# Patient Record
Sex: Female | Born: 1980
Health system: Southern US, Community
[De-identification: ages and names within clinical notes are randomized; demographics above are authoritative.]

## PROBLEM LIST (undated history)

## (undated) DIAGNOSIS — J45909 Unspecified asthma, uncomplicated: Secondary | ICD-10-CM

## (undated) DIAGNOSIS — J309 Allergic rhinitis, unspecified: Secondary | ICD-10-CM

## (undated) HISTORY — DX: Allergic rhinitis, unspecified: J30.9

## (undated) HISTORY — DX: Unspecified asthma, uncomplicated: J45.909

## (undated) HISTORY — PX: WISDOM TOOTH EXTRACTION: SHX21

---
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
## 2000-02-13 ENCOUNTER — Other Ambulatory Visit: Admission: RE | Admit: 2000-02-13 | Discharge: 2000-02-13 | Payer: Self-pay | Admitting: *Deleted

## 2001-02-11 ENCOUNTER — Other Ambulatory Visit: Admission: RE | Admit: 2001-02-11 | Discharge: 2001-02-11 | Payer: Self-pay | Admitting: Obstetrics and Gynecology

## 2003-02-16 ENCOUNTER — Other Ambulatory Visit: Admission: RE | Admit: 2003-02-16 | Discharge: 2003-02-16 | Payer: Self-pay | Admitting: Obstetrics and Gynecology

## 2005-03-12 ENCOUNTER — Encounter: Admission: RE | Admit: 2005-03-12 | Discharge: 2005-03-12 | Payer: Self-pay | Admitting: Sports Medicine

## 2006-11-23 ENCOUNTER — Inpatient Hospital Stay (HOSPITAL_COMMUNITY): Admission: AD | Admit: 2006-11-23 | Discharge: 2006-11-23 | Payer: Self-pay | Admitting: Gynecology

## 2006-11-26 ENCOUNTER — Inpatient Hospital Stay (HOSPITAL_COMMUNITY): Admission: RE | Admit: 2006-11-26 | Discharge: 2006-11-29 | Payer: Self-pay | Admitting: Obstetrics and Gynecology

## 2009-10-05 ENCOUNTER — Inpatient Hospital Stay (HOSPITAL_COMMUNITY)
Admission: RE | Admit: 2009-10-05 | Discharge: 2009-10-08 | Payer: Self-pay | Source: Home / Self Care | Admitting: Obstetrics and Gynecology

## 2010-04-06 LAB — CBC
HCT: 31.3 % — ABNORMAL LOW (ref 36.0–46.0)
HCT: 37.9 % (ref 36.0–46.0)
Hemoglobin: 10.9 g/dL — ABNORMAL LOW (ref 12.0–15.0)
Hemoglobin: 12.9 g/dL (ref 12.0–15.0)
MCH: 32.2 pg (ref 26.0–34.0)
MCH: 31.4 pg (ref 26.0–34.0)
MCHC: 35 g/dL (ref 30.0–36.0)
MCHC: 33.9 g/dL (ref 30.0–36.0)
MCV: 92.2 fL (ref 78.0–100.0)
MCV: 92.5 fL (ref 78.0–100.0)
Platelets: 163 10*3/uL (ref 150–400)
Platelets: 204 10*3/uL (ref 150–400)
RBC: 3.39 MIL/uL — ABNORMAL LOW (ref 3.87–5.11)
RBC: 4.1 MIL/uL (ref 3.87–5.11)
RDW: 13.8 % (ref 11.5–15.5)
RDW: 13.2 % (ref 11.5–15.5)
WBC: 12.3 10*3/uL — ABNORMAL HIGH (ref 4.0–10.5)
WBC: 12.6 10*3/uL — ABNORMAL HIGH (ref 4.0–10.5)

## 2010-04-06 LAB — SURGICAL PCR SCREEN
MRSA, PCR: NEGATIVE
Staphylococcus aureus: NEGATIVE

## 2010-04-06 LAB — RPR: RPR Ser Ql: NONREACTIVE

## 2010-10-31 LAB — DIFFERENTIAL
Basophils Absolute: 0
Lymphocytes Relative: 15
Monocytes Absolute: 0.9 — ABNORMAL HIGH
Monocytes Relative: 6
Neutro Abs: 12.5 — ABNORMAL HIGH

## 2010-10-31 LAB — CBC
HCT: 29.1 — ABNORMAL LOW
Hemoglobin: 13.5
MCHC: 33.3
MCHC: 34.9
MCV: 89.3
MCV: 92.1
MCV: 92.1
Platelets: 188
Platelets: 207
RBC: 4.39
RDW: 13.7
WBC: 13.7 — ABNORMAL HIGH

## 2010-10-31 LAB — COMPREHENSIVE METABOLIC PANEL
ALT: 28
AST: 26
Albumin: 1.9 — ABNORMAL LOW
Albumin: 2.4 — ABNORMAL LOW
BUN: 6
Calcium: 9.1
Chloride: 106
Creatinine, Ser: 0.69
Creatinine, Ser: 0.73
GFR calc Af Amer: >60
Sodium: 137
Total Bilirubin: 0.6
Total Bilirubin: 0.7
Total Protein: 4.6 — ABNORMAL LOW

## 2010-10-31 LAB — URINALYSIS, DIPSTICK ONLY
Bilirubin Urine: NEGATIVE
Glucose, UA: NEGATIVE
Ketones, ur: NEGATIVE
Leukocytes, UA: NEGATIVE
Nitrite: NEGATIVE
Protein, ur: NEGATIVE
Specific Gravity, Urine: 1.02
Urobilinogen, UA: 0.2
pH: 7

## 2013-03-26 ENCOUNTER — Ambulatory Visit (INDEPENDENT_AMBULATORY_CARE_PROVIDER_SITE_OTHER): Payer: PRIVATE HEALTH INSURANCE | Admitting: Physician Assistant

## 2013-03-26 ENCOUNTER — Encounter: Payer: Self-pay | Admitting: Physician Assistant

## 2013-03-26 VITALS — BP 136/72 | HR 86 | Temp 98.2°F | Resp 18 | Ht 66.0 in | Wt 183.0 lb

## 2013-03-26 DIAGNOSIS — A499 Bacterial infection, unspecified: Secondary | ICD-10-CM

## 2013-03-26 DIAGNOSIS — J988 Other specified respiratory disorders: Secondary | ICD-10-CM

## 2013-03-26 DIAGNOSIS — B9689 Other specified bacterial agents as the cause of diseases classified elsewhere: Principal | ICD-10-CM

## 2013-03-26 MED ORDER — AZITHROMYCIN 250 MG PO TABS: ORAL_TABLET | ORAL | Status: DC

## 2013-03-26 NOTE — Progress Notes (Signed)
    Patient ID: Tammy Dorsey MRN: [id number], DOB: 18-Aug-1980, 33 y.o. Date of Encounter: 03/26/2013, 3:46 PM    Chief Complaint:  Chief Complaint  Patient presents with  . Ilnness    cough, runny nose, sore throat, headache, head congestion     HPI: 33 y.o. year old white female is that she has been sick for 5 days and is only getting worse. Says it started with some sore throat and headache but since has developed into a lot of head and nasal congestion and sinus congestion. Now is also developing some chest congestion and cough. Has had no known fevers or chills.     Home Meds: See attached medication section for any medications that were entered at today's visit. The computer does not put those onto this list.The following list is a list of meds entered prior to today's visit.   No current outpatient prescriptions on file prior to visit.   No current facility-administered medications on file prior to visit.    Allergies: No Known Allergies    Review of Systems: See HPI for pertinent ROS. All other ROS negative.    Physical Exam: Blood pressure 136/72, pulse 86, temperature 98.2 F (36.8 C), resp. rate 18, height 5\' 6"  (1.676 m), weight 183 lb (83.008 kg), last menstrual period 03/06/2013., Body mass index is 29.55 kg/(m^2). General: WNWD WF.  Appears in no acute distress. HEENT: Normocephalic, atraumatic, eyes without discharge, sclera non-icteric, nares are without discharge. Bilateral auditory canals clear, TM's are without perforation, pearly grey and translucent with reflective cone of light bilaterally. Oral cavity moist, posterior pharynx without exudate, erythema, peritonsillar abscess. No tenderness with percussion of frontal and maxillary sinuses.  Neck: Supple. No thyromegaly. No lymphadenopathy. Lungs: Clear bilaterally to auscultation without wheezes, rales, or rhonchi. Breathing is unlabored. Heart: Regular rhythm. No murmurs, rubs, or gallops. Msk:  Strength  and tone normal for age. Extremities/Skin: Warm and dry. Neuro: Alert and oriented X 3. Moves all extremities spontaneously. Gait is normal. CNII-XII grossly in tact. Psych:  Responds to questions appropriately with a normal affect.     ASSESSMENT AND PLAN:  33 y.o. year old female with  1. Bacterial respiratory infection - azithromycin (ZITHROMAX) 250 MG tablet; Day 1: Take 2 daily. Days 2-5: Take 1 daily.  Dispense: 6 tablet; Refill: 0 Can continue over-the-counter decongestants and cough medications as needed for symptom relief. Followup if symptoms do not resolve within one week after completion of antibiotics.  [address] [place], [place], [facility] 03/26/2013 3:46 PM

## 2014-07-06 ENCOUNTER — Ambulatory Visit (INDEPENDENT_AMBULATORY_CARE_PROVIDER_SITE_OTHER): Payer: 59 | Admitting: Family Medicine

## 2014-07-06 ENCOUNTER — Encounter: Payer: Self-pay | Admitting: Family Medicine

## 2014-07-06 VITALS — BP 112/70 | HR 62 | Temp 98.0°F | Resp 14 | Ht 66.0 in | Wt 185.0 lb

## 2014-07-06 DIAGNOSIS — R55 Syncope and collapse: Secondary | ICD-10-CM

## 2014-07-06 DIAGNOSIS — Z0000 Encounter for general adult medical examination without abnormal findings: Secondary | ICD-10-CM | POA: Diagnosis not present

## 2014-07-06 LAB — CBC WITH DIFFERENTIAL/PLATELET
BASOS ABS: 0 10*3/uL (ref 0.0–0.1)
Basophils Relative: 0 % (ref 0–1)
EOS PCT: 2 % (ref 0–5)
Eosinophils Absolute: 0.1 10*3/uL (ref 0.0–0.7)
HCT: 40.3 % (ref 36.0–46.0)
Hemoglobin: 13.4 g/dL (ref 12.0–15.0)
LYMPHS ABS: 2.1 10*3/uL (ref 0.7–4.0)
LYMPHS PCT: 32 % (ref 12–46)
MCH: 29.1 pg (ref 26.0–34.0)
MCHC: 33.3 g/dL (ref 30.0–36.0)
MCV: 87.6 fL (ref 78.0–100.0)
MPV: 9.8 fL (ref 8.6–12.4)
Monocytes Absolute: 0.5 10*3/uL (ref 0.1–1.0)
Monocytes Relative: 7 % (ref 3–12)
NEUTROS ABS: 4 10*3/uL (ref 1.7–7.7)
NEUTROS PCT: 59 % (ref 43–77)
Platelets: 267 10*3/uL (ref 150–400)
RBC: 4.6 MIL/uL (ref 3.87–5.11)
RDW: 13 % (ref 11.5–15.5)
WBC: 6.7 10*3/uL (ref 4.0–10.5)

## 2014-07-06 LAB — LIPID PANEL
CHOL/HDL RATIO: 2.6 ratio
Cholesterol: 139 mg/dL (ref 0–200)
HDL: 54 mg/dL (ref >=46)
LDL Cholesterol: 74 mg/dL (ref 0–99)
TRIGLYCERIDES: 53 mg/dL (ref <150)
VLDL: 11 mg/dL (ref 0–40)

## 2014-07-06 LAB — TSH: TSH: 1.084 u[IU]/mL (ref 0.350–4.500)

## 2014-07-06 LAB — COMPLETE METABOLIC PANEL WITH GFR
ALT: 23 U/L (ref 0–35)
AST: 19 U/L (ref 0–37)
Albumin: 4.1 g/dL (ref 3.5–5.2)
Alkaline Phosphatase: 42 U/L (ref 39–117)
BILIRUBIN TOTAL: 0.5 mg/dL (ref 0.2–1.2)
BUN: 14 mg/dL (ref 6–23)
CALCIUM: 9.2 mg/dL (ref 8.4–10.5)
CHLORIDE: 103 meq/L (ref 96–112)
CO2: 26 mEq/L (ref 19–32)
CREATININE: 0.93 mg/dL (ref 0.50–1.10)
GFR, Est African American: >89 mL/min
GFR, Est Non African American: 81 mL/min
Glucose, Bld: 88 mg/dL (ref 70–99)
Potassium: 3.9 mEq/L (ref 3.5–5.3)
SODIUM: 138 meq/L (ref 135–145)
Total Protein: 6.7 g/dL (ref 6.0–8.3)

## 2014-07-06 NOTE — Progress Notes (Signed)
Subjective:    Patient ID: Tammy Dorsey, female    DOB: 1980-11-10, 34 y.o.   MRN: [id number]  HPI Patient is a very pleasant 34 year old white female who presents today for complete physical exam. Recently she has been exercising more performing yoga. She has developed exercise induced near syncope almost on a daily basis. It is associated with dizziness and some vertigo. She denies palpitations. She denies shortness of breath. She denies angina. Symptoms improve with rest and are worse with activity. She denies any BPPV symptoms. Her Dix-Hallpike maneuver is negative today. She denies any fevers or chills. She denies any melanoma or hematochezia. She denies any easy bruising or heavy bleeding. She denies any nausea or vomiting. She denies any dehydration. She drinks plenty of fluids. Today her blood pressure is asymmetric in her arms 112/70 left, 92/70 right. No past medical history on file. Patient has 2 children. Uncomplicated childbirth No past surgical history on file. No current outpatient prescriptions on file prior to visit.   No current facility-administered medications on file prior to visit.   No Known Allergies History   Social History  . Marital Status: Married    Spouse Name: N/A  . Number of Children: N/A  . Years of Education: N/A   Occupational History  . Not on file.   Social History Main Topics  . Smoking status: Never Smoker   . Smokeless tobacco: Never Used  . Alcohol Use: 1.2 oz/week    2 Glasses of wine per week  . Drug Use: No  . Sexual Activity: Yes     Comment: Married, 2 kids   Other Topics Concern  . Not on file   Social History Narrative   Family History  Problem Relation Age of Onset  . Heart disease Maternal Uncle   . Early death Neg Hx       Review of Systems  All other systems reviewed and are negative.      Objective:   Physical Exam  Constitutional: She is oriented to person, place, and time. She appears well-developed  and well-nourished. No distress.  HENT:  Head: Normocephalic and atraumatic.  Right Ear: External ear normal.  Left Ear: External ear normal.  Nose: Nose normal.  Mouth/Throat: Oropharynx is clear and moist. No oropharyngeal exudate.  Eyes: Conjunctivae and EOM are normal. Pupils are equal, round, and reactive to light. Right eye exhibits no discharge. Left eye exhibits no discharge. No scleral icterus.  Neck: Normal range of motion. Neck supple. No JVD present. No tracheal deviation present. No thyromegaly present.  Cardiovascular: Normal rate, regular rhythm, normal heart sounds and intact distal pulses.  Exam reveals no gallop and no friction rub.   No murmur heard. Pulmonary/Chest: Effort normal and breath sounds normal. No stridor. No respiratory distress. She has no wheezes. She has no rales. She exhibits no tenderness.  Abdominal: Soft. Bowel sounds are normal. She exhibits no distension and no mass. There is no tenderness. There is no rebound and no guarding.  Musculoskeletal: Normal range of motion. She exhibits no edema or tenderness.  Lymphadenopathy:    She has no cervical adenopathy.  Neurological: She is alert and oriented to person, place, and time. She has normal reflexes. She displays normal reflexes. No cranial nerve deficit. She exhibits normal muscle tone. Coordination normal.  Skin: Skin is warm. No rash noted. She is not diaphoretic. No erythema. No pallor.  Psychiatric: She has a normal mood and affect. Her behavior is normal. Judgment and  thought content normal.  Vitals reviewed.         Assessment & Plan:  Routine general medical examination at a health care facility - Plan: CBC with Differential/Platelet, COMPLETE METABOLIC PANEL WITH GFR, Lipid panel, TSH  Near syncope - Plan: EKG 12-Lead  I will begin with basic lab work including a CBC, CMP, fasting lipid panel, and TSH. I will also check an EKG. I believe we first need to rule out the patient for anemia  given her age and her normal menstrual cycles. If there is no evidence of anemia or other laboratory abnormalities, I would next pursue the asymmetric blood pressures that she has. Although unlikely, I need to rule out subclavian steal as a possible cause of her near syncope. I will also evaluate her heart with an echocardiogram although I can appreciate no murmurs and I believe the likelihood of cardiomyopathy is very low. I would also consider a 24-hour Holter monitor to rule out cardiac arrhythmias. EKG performed today in office shows normal sinus rhythm with normal intervals and a normal axis and no evidence of ischemia or infarction.

## 2014-07-08 ENCOUNTER — Other Ambulatory Visit: Payer: Self-pay | Admitting: Family Medicine

## 2014-07-08 DIAGNOSIS — R0989 Other specified symptoms and signs involving the circulatory and respiratory systems: Secondary | ICD-10-CM

## 2014-07-08 DIAGNOSIS — R55 Syncope and collapse: Secondary | ICD-10-CM

## 2014-07-09 ENCOUNTER — Telehealth: Payer: Self-pay | Admitting: Family Medicine

## 2014-07-09 ENCOUNTER — Other Ambulatory Visit: Payer: Self-pay

## 2014-07-09 NOTE — Telephone Encounter (Signed)
Pt aware.

## 2014-07-09 NOTE — Telephone Encounter (Signed)
Let's do 72 hour.

## 2014-07-09 NOTE — Telephone Encounter (Signed)
Patient states that when the cardiologist called her yesterday to set up the monitor she said that they told her it was for 72 hours.  Patient states when she spoke with Dr. Dennard Schaumann and Lovey Newcomer they both told her it was going to be a 24 hour monitor. Please call patient back and confirm which one she is supposed to do.

## 2014-07-12 ENCOUNTER — Ambulatory Visit
Admission: RE | Admit: 2014-07-12 | Discharge: 2014-07-12 | Disposition: A | Discharge status: Home / Self Care | Payer: 59 | Source: Ambulatory Visit | Attending: Family Medicine | Admitting: Family Medicine

## 2014-07-12 DIAGNOSIS — R55 Syncope and collapse: Secondary | ICD-10-CM

## 2014-07-12 DIAGNOSIS — R0989 Other specified symptoms and signs involving the circulatory and respiratory systems: Secondary | ICD-10-CM

## 2014-07-12 MED ORDER — IOPAMIDOL (ISOVUE-370) INJECTION 76%
75.0000 mL | Freq: Once | INTRAVENOUS | Status: AC | PRN
  Administered 2014-07-12: 75 mL via INTRAVENOUS

## 2014-07-14 ENCOUNTER — Encounter (INDEPENDENT_AMBULATORY_CARE_PROVIDER_SITE_OTHER): Payer: 59

## 2014-07-14 ENCOUNTER — Ambulatory Visit (HOSPITAL_COMMUNITY): Payer: 59 | Attending: Cardiology

## 2014-07-14 ENCOUNTER — Other Ambulatory Visit: Payer: Self-pay

## 2014-07-14 DIAGNOSIS — R55 Syncope and collapse: Secondary | ICD-10-CM | POA: Insufficient documentation

## 2014-07-14 HISTORY — DX: Syncope and collapse: R55

## 2015-06-09 ENCOUNTER — Encounter: Payer: Self-pay | Admitting: Family Medicine

## 2015-06-09 ENCOUNTER — Ambulatory Visit (INDEPENDENT_AMBULATORY_CARE_PROVIDER_SITE_OTHER): Payer: Commercial Managed Care - PPO | Admitting: Family Medicine

## 2015-06-09 VITALS — BP 122/62 | HR 80 | Temp 98.9°F | Resp 14 | Ht 66.0 in | Wt 188.0 lb

## 2015-06-09 DIAGNOSIS — R5382 Chronic fatigue, unspecified: Secondary | ICD-10-CM | POA: Diagnosis not present

## 2015-06-09 DIAGNOSIS — R1012 Left upper quadrant pain: Secondary | ICD-10-CM | POA: Diagnosis not present

## 2015-06-09 LAB — COMPLETE METABOLIC PANEL WITH GFR
ALBUMIN: 4.1 g/dL (ref 3.6–5.1)
ALT: 19 U/L (ref 6–29)
AST: 17 U/L (ref 10–30)
Alkaline Phosphatase: 40 U/L (ref 33–115)
BUN: 15 mg/dL (ref 7–25)
CHLORIDE: 101 mmol/L (ref 98–110)
CO2: 27 mmol/L (ref 20–31)
Calcium: 9.1 mg/dL (ref 8.6–10.2)
Creat: 0.86 mg/dL (ref 0.50–1.10)
GFR, EST NON AFRICAN AMERICAN: 88 mL/min (ref >=60)
GFR, Est African American: >89 mL/min (ref >=60)
GLUCOSE: 83 mg/dL (ref 70–99)
POTASSIUM: 4.2 mmol/L (ref 3.5–5.3)
Sodium: 137 mmol/L (ref 135–146)
TOTAL PROTEIN: 6.4 g/dL (ref 6.1–8.1)
Total Bilirubin: 0.4 mg/dL (ref 0.2–1.2)

## 2015-06-09 LAB — CBC WITH DIFFERENTIAL/PLATELET
BASOS PCT: 0 %
Basophils Absolute: 0 cells/uL (ref 0–200)
EOS ABS: 91 {cells}/uL (ref 15–500)
Eosinophils Relative: 1 %
HEMATOCRIT: 41.2 % (ref 35.0–45.0)
HEMOGLOBIN: 13.8 g/dL (ref 12.0–15.0)
LYMPHS ABS: 2821 {cells}/uL (ref 850–3900)
LYMPHS PCT: 31 %
MCH: 29.7 pg (ref 27.0–33.0)
MCHC: 33.5 g/dL (ref 32.0–36.0)
MCV: 88.6 fL (ref 80.0–100.0)
MONO ABS: 637 {cells}/uL (ref 200–950)
MPV: 9.8 fL (ref 7.5–12.5)
Monocytes Relative: 7 %
Neutro Abs: 5551 cells/uL (ref 1500–7800)
Neutrophils Relative %: 61 %
Platelets: 240 10*3/uL (ref 140–400)
RBC: 4.65 MIL/uL (ref 3.80–5.10)
RDW: 12.7 % (ref 11.0–15.0)
WBC: 9.1 10*3/uL (ref 3.8–10.8)

## 2015-06-09 LAB — TSH: TSH: 1.21 m[IU]/L

## 2015-06-09 LAB — VITAMIN B12: Vitamin B-12: 518 pg/mL (ref 200–1100)

## 2015-06-09 NOTE — Progress Notes (Signed)
Subjective:    Patient ID: Tammy Dorsey, female    DOB: Apr 21, 1980, 35 y.o.   MRN: [id number]  HPI Patient is a very pleasant 35 year old white female who presents today with 10-14 days of fatigue. Around that same time she developed an evanescent rash on her chest and on her arms. She went to an urgent care and was given a steroid shot and the rash resolved. They're treating her for some type of allergic reaction. She states that the rash was not raised. She describes a rash more as a blotchy erythema on her chest and arms. She is not sure what she was having allergic reaction to. [place] but has not witnessed any tick bites. Around the same time she is also developed a dull to sharp pain in her left posterior neck. She also has an occasional dull headache which is nonspecific and nonpulsatile with no photophobia. She is also developed a persistent mild left upper quadrant abdominal pain. She denies any fevers or chills. She denies any otalgia or sore throat. Her child has been sick recently with a viral illness. She denies any chest pain shortness of breath or dyspnea on exertion. She denies any nausea vomiting or diarrhea. She denies any black tarry stools or blood in her stool. She denies any hematuria or dysuria. She does have some mild joint pains. She is checked urine pregnancy test at home and it was negative. She also has a Mirena she denies any recent travel. No past medical history on file. No past surgical history on file. No current outpatient prescriptions on file prior to visit.   No current facility-administered medications on file prior to visit.   No Known Allergies Social History   Social History  . Marital Status: Married    Spouse Name: N/A  . Number of Children: N/A  . Years of Education: N/A   Occupational History  . Not on file.   Social History Main Topics  . Smoking status: Never Smoker   . Smokeless tobacco: Never Used  . Alcohol  Use: 1.2 oz/week    2 Glasses of wine per week  . Drug Use: No  . Sexual Activity: Yes     Comment: Married, 2 kids   Other Topics Concern  . Not on file   Social History Narrative      Review of Systems  All other systems reviewed and are negative.      Objective:   Physical Exam  Constitutional: She appears well-developed and well-nourished. No distress.  HENT:  Mouth/Throat: Oropharynx is clear and moist. No oropharyngeal exudate.  Eyes: Conjunctivae are normal. No scleral icterus.  Neck: Neck supple. No JVD present. No thyromegaly present.  Cardiovascular: Normal rate, regular rhythm and normal heart sounds.   Pulmonary/Chest: Effort normal and breath sounds normal. No respiratory distress.  Abdominal: Soft. Bowel sounds are normal. She exhibits no distension. There is no tenderness. There is no rebound and no guarding.  Musculoskeletal: She exhibits no edema.  Lymphadenopathy:    She has no cervical adenopathy.  Skin: No rash noted. She is not diaphoretic. No erythema.  Vitals reviewed.         Assessment & Plan:  Chronic fatigue - Plan: CBC with Differential/Platelet, COMPLETE METABOLIC PANEL WITH GFR, B. burgdorfi antibodies by WB, [place] m[place] spotted fvr abs pnl(IgG+IgM), Sedimentation rate, TSH, Vitamin B12, EBV ab to viral capsid ag pnl, IgG+IgM, CMV IgM, hCG, serum, qualitative  LUQ abdominal pain -  Plan: CBC with Differential/Platelet, COMPLETE METABOLIC PANEL WITH GFR, EBV ab to viral capsid ag pnl, IgG+IgM, CMV IgM, hCG, serum, qualitative  Difficult history with massive differential diagnosis. More than likely, the patient has a viral syndrome. Given her child's recent illness and her profound fatigue, I will check her for Epstein-Barr virus and cytomegalovirus. Given the time of year, I will also check for Lyme disease. I will check a CBC, CMP. I will check a sedimentation rate a TSH and a vitamin B12. I will also check a serum pregnancy test. Further workup  will be determined by the results of lab studies.

## 2015-06-10 LAB — SEDIMENTATION RATE: SED RATE: 1 mm/h (ref 0–20)

## 2015-06-10 LAB — EBV AB TO VIRAL CAPSID AG PNL, IGG+IGM: EBV VCA IGG: 177 U/mL — AB (ref <18.0)

## 2015-06-10 LAB — HCG, SERUM, QUALITATIVE: PREG SERUM: NEGATIVE

## 2015-06-10 LAB — CMV IGM: CMV IgM: <30 AU/mL (ref <30.00)

## 2015-06-13 ENCOUNTER — Telehealth: Payer: Self-pay | Admitting: Family Medicine

## 2015-06-13 LAB — ROCKY MTN SPOTTED FVR ABS PNL(IGG+IGM)
RMSF IgG: NOT DETECTED
RMSF IgM: NOT DETECTED

## 2015-06-13 LAB — LYME ABY, WSTRN BLT IGG & IGM W/BANDS
B BURGDORFERI IGM ABS (IB): NEGATIVE
B burgdorferi IgG Abs (IB): NEGATIVE
LYME DISEASE 18 KD IGG: NONREACTIVE
LYME DISEASE 28 KD IGG: NONREACTIVE
LYME DISEASE 30 KD IGG: NONREACTIVE
LYME DISEASE 41 KD IGM: NONREACTIVE
LYME DISEASE 58 KD IGG: NONREACTIVE
Lyme Disease 23 kD IgG: NONREACTIVE
Lyme Disease 23 kD IgM: NONREACTIVE
Lyme Disease 39 kD IgG: REACTIVE — AB
Lyme Disease 39 kD IgM: NONREACTIVE
Lyme Disease 41 kD IgG: NONREACTIVE
Lyme Disease 45 kD IgG: NONREACTIVE
Lyme Disease 66 kD IgG: NONREACTIVE
Lyme Disease 93 kD IgG: NONREACTIVE

## 2015-06-13 NOTE — Telephone Encounter (Signed)
Pt is calling for her lab results.  Please call [phone]

## 2015-06-15 NOTE — Telephone Encounter (Signed)
Pt aware - see lab note

## 2016-03-23 DIAGNOSIS — R1031 Right lower quadrant pain: Secondary | ICD-10-CM | POA: Diagnosis not present

## 2016-05-07 DIAGNOSIS — Z01419 Encounter for gynecological examination (general) (routine) without abnormal findings: Secondary | ICD-10-CM | POA: Diagnosis not present

## 2016-08-07 DIAGNOSIS — R6889 Other general symptoms and signs: Secondary | ICD-10-CM | POA: Diagnosis not present

## 2016-08-07 DIAGNOSIS — B349 Viral infection, unspecified: Secondary | ICD-10-CM | POA: Diagnosis not present

## 2016-10-02 ENCOUNTER — Ambulatory Visit (INDEPENDENT_AMBULATORY_CARE_PROVIDER_SITE_OTHER): Payer: 59 | Admitting: Family Medicine

## 2016-10-02 ENCOUNTER — Encounter: Payer: Self-pay | Admitting: Family Medicine

## 2016-10-02 VITALS — BP 118/64 | HR 82 | Temp 97.9°F | Resp 14 | Ht 66.0 in | Wt 194.0 lb

## 2016-10-02 DIAGNOSIS — B9689 Other specified bacterial agents as the cause of diseases classified elsewhere: Secondary | ICD-10-CM | POA: Diagnosis not present

## 2016-10-02 DIAGNOSIS — J019 Acute sinusitis, unspecified: Secondary | ICD-10-CM

## 2016-10-02 MED ORDER — FLUCONAZOLE 150 MG PO TABS: 150.0000 mg | ORAL_TABLET | Freq: Once | ORAL | 0 refills | Status: AC

## 2016-10-02 MED ORDER — AMOXICILLIN 875 MG PO TABS: 875.0000 mg | ORAL_TABLET | Freq: Two times a day (BID) | ORAL | 0 refills | Status: DC

## 2016-10-02 NOTE — Progress Notes (Signed)
   Subjective:    Patient ID: Tammy Dorsey, female    DOB: April 27, 1980, 36 y.o.   MRN: [id number]  HPI The patient's symptoms started with a cold more than a week ago. Similar symptoms were shared by other members of her family. They have all improved. She was initially improving but over the last 24 hours things have noticeably worsened. She is now running a low-grade fever of 99-100. She reports increasing postnasal drip, increasing sinus pressure, increasing ear pain, and headache. She reports fatigue. She also reports a scratchy throat due to drainage. History reviewed. No pertinent past medical history. History reviewed. No pertinent surgical history. No current outpatient prescriptions on file prior to visit.   No current facility-administered medications on file prior to visit.    No Known Allergies Social History   Social History  . Marital status: Married    Spouse name: N/A  . Number of children: N/A  . Years of education: N/A   Occupational History  . Not on file.   Social History Main Topics  . Smoking status: Never Smoker  . Smokeless tobacco: Never Used  . Alcohol use 1.2 oz/week    2 Glasses of wine per week  . Drug use: No  . Sexual activity: Yes     Comment: Married, 2 kids   Other Topics Concern  . Not on file   Social History Narrative  . No narrative on file      Review of Systems  All other systems reviewed and are negative.      Objective:   Physical Exam  Constitutional: She appears well-developed and well-nourished.  HENT:  Right Ear: External ear normal.  Left Ear: External ear normal.  Nose: Mucosal edema and rhinorrhea present. Right sinus exhibits frontal sinus tenderness. Left sinus exhibits frontal sinus tenderness.  Mouth/Throat: Oropharynx is clear and moist. No oropharyngeal exudate, posterior oropharyngeal edema or posterior oropharyngeal erythema.  Eyes: Conjunctivae are normal.  Neck: Neck supple.  Cardiovascular: Normal  rate, regular rhythm and normal heart sounds.   Pulmonary/Chest: Effort normal and breath sounds normal. No respiratory distress. She has no wheezes. She has no rales.  Lymphadenopathy:    She has no cervical adenopathy.  Vitals reviewed.         Assessment & Plan:  Acute bacterial rhinosinusitis - Plan: amoxicillin (AMOXIL) 875 MG tablet, fluconazole (DIFLUCAN) 150 MG tablet  I believe the patient had a viral upper respiratory infection that is subsequently developed a secondary bacterial sinus infection. This would explain the improvement followed by sudden worsening over the passage of time. Begin amoxicillin 875 mg by mouth twice a day for 10 days. I gave the patient a prescription for Diflucan in case she develops a yeast infection. Use Sudafed and Flonase for head congestion.

## 2017-02-10 DIAGNOSIS — J018 Other acute sinusitis: Secondary | ICD-10-CM | POA: Diagnosis not present

## 2017-02-28 ENCOUNTER — Encounter: Payer: Self-pay | Admitting: Family Medicine

## 2017-02-28 ENCOUNTER — Ambulatory Visit
Admission: RE | Admit: 2017-02-28 | Discharge: 2017-02-28 | Disposition: A | Discharge status: Home / Self Care | Payer: 59 | Source: Ambulatory Visit | Attending: Family Medicine | Admitting: Family Medicine

## 2017-02-28 ENCOUNTER — Ambulatory Visit (INDEPENDENT_AMBULATORY_CARE_PROVIDER_SITE_OTHER): Payer: Self-pay | Admitting: Family Medicine

## 2017-02-28 VITALS — BP 118/80 | HR 76 | Temp 98.0°F | Resp 16 | Ht 66.0 in | Wt 182.0 lb

## 2017-02-28 DIAGNOSIS — M545 Low back pain: Secondary | ICD-10-CM | POA: Diagnosis not present

## 2017-02-28 DIAGNOSIS — S134XXA Sprain of ligaments of cervical spine, initial encounter: Secondary | ICD-10-CM

## 2017-02-28 DIAGNOSIS — G5603 Carpal tunnel syndrome, bilateral upper limbs: Secondary | ICD-10-CM

## 2017-02-28 DIAGNOSIS — S3992XA Unspecified injury of lower back, initial encounter: Secondary | ICD-10-CM | POA: Diagnosis not present
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
## 2017-03-01 ENCOUNTER — Telehealth: Payer: Self-pay | Admitting: Family Medicine

## 2017-03-01 MED ORDER — CYCLOBENZAPRINE HCL 10 MG PO TABS: 10.0000 mg | ORAL_TABLET | Freq: Three times a day (TID) | ORAL | 0 refills | Status: DC | PRN

## 2017-03-01 NOTE — Telephone Encounter (Signed)
Pt called and is aware of xray results but wanted to know what she should do now in the meantime.   Per Dr. Dennard Schaumann needs to give 2-3 weeks and can use Flexeril in the meantime.  Med sent to pharm and pt aware.

## 2017-03-12 DIAGNOSIS — G5603 Carpal tunnel syndrome, bilateral upper limbs: Secondary | ICD-10-CM | POA: Diagnosis not present

## 2017-03-19 DIAGNOSIS — M79641 Pain in right hand: Secondary | ICD-10-CM | POA: Diagnosis not present

## 2017-03-19 DIAGNOSIS — R202 Paresthesia of skin: Secondary | ICD-10-CM | POA: Diagnosis not present

## 2017-03-19 DIAGNOSIS — M79642 Pain in left hand: Secondary | ICD-10-CM | POA: Diagnosis not present

## 2017-03-19 DIAGNOSIS — G5603 Carpal tunnel syndrome, bilateral upper limbs: Secondary | ICD-10-CM | POA: Diagnosis not present

## 2017-03-20 ENCOUNTER — Telehealth: Payer: Self-pay | Admitting: Family Medicine

## 2017-03-20 DIAGNOSIS — M545 Low back pain, unspecified: Secondary | ICD-10-CM

## 2017-03-20 NOTE — Telephone Encounter (Signed)
Pt called [facility] stating that her back is no better at all and is wondering what else she can do for the pain?

## 2017-03-21 MED ORDER — DICLOFENAC SODIUM 75 MG PO TBEC: 75.0000 mg | DELAYED_RELEASE_TABLET | Freq: Two times a day (BID) | ORAL | 1 refills | Status: DC

## 2017-03-21 NOTE — Telephone Encounter (Signed)
Patient aware of providers recommendations and would like to do PT, she does not need any more Flexeril and would like to try the Diclofenac. Med sent to pharm and referral placed.

## 2017-03-21 NOTE — Telephone Encounter (Signed)
I would recommend PT.  Meanwhile use diclofenac 75 mg pobid and flexeril 10 mg q 8 hrs muscle spasms (can make her sleepy).

## 2017-04-01 ENCOUNTER — Telehealth (HOSPITAL_COMMUNITY): Payer: Self-pay | Admitting: Family Medicine

## 2017-04-01 NOTE — Telephone Encounter (Signed)
04/01/17  Pt called and cx appt here and asked that we forward the referral to [facility].  She said it would be eaiser for her to go there because of work and having to pick up her kids.

## 2017-04-04 ENCOUNTER — Ambulatory Visit (HOSPITAL_COMMUNITY): Payer: 59

## 2017-04-23 ENCOUNTER — Ambulatory Visit: Payer: 59 | Attending: Family Medicine | Admitting: Physical Therapy

## 2017-04-23 ENCOUNTER — Encounter: Payer: Self-pay | Admitting: Physical Therapy

## 2017-04-23 DIAGNOSIS — R252 Cramp and spasm: Secondary | ICD-10-CM | POA: Insufficient documentation

## 2017-04-23 DIAGNOSIS — M6281 Muscle weakness (generalized): Secondary | ICD-10-CM | POA: Insufficient documentation

## 2017-04-23 DIAGNOSIS — M545 Low back pain, unspecified: Secondary | ICD-10-CM
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
## 2017-04-30 ENCOUNTER — Encounter: Payer: Self-pay | Admitting: Physical Therapy

## 2017-04-30 ENCOUNTER — Ambulatory Visit: Payer: 59 | Admitting: Physical Therapy

## 2017-04-30 DIAGNOSIS — M6281 Muscle weakness (generalized): Secondary | ICD-10-CM

## 2017-04-30 DIAGNOSIS — M545 Low back pain, unspecified: Secondary | ICD-10-CM

## 2017-04-30 DIAGNOSIS — R252 Cramp and spasm: Secondary | ICD-10-CM

## 2017-04-30 NOTE — Therapy (Signed)
[place], [place], [address] Phone: [phone]   Fax:  [phone]  Physical Therapy Treatment  Patient Details  Name: Tammy Dorsey MRN: [id number] Date of Birth: [birth year] Referring Provider: Dr. Jenna Luo    Encounter Date: 04/30/2017  PT End of Session - 04/30/17 0813    Visit Number  2    Number of Visits  12    Date for PT Re-Evaluation  06/04/17    PT Start Time  0803    PT Stop Time  0850    PT Time Calculation (min)  47 min    Activity Tolerance  Patient tolerated treatment well    Behavior During Therapy  [facility] for tasks assessed/performed       History reviewed. No pertinent past medical history.  History reviewed. No pertinent surgical history.  There were no vitals filed for this visit.  Subjective Assessment - 04/30/17 0803    Subjective  No new complaints     Currently in Pain?  Yes    Pain Score  5     Pain Orientation  Lower;Left    Pain Type  Acute pain    Pain Onset  More than a month ago              [facility] Adult PT Treatment/Exercise - 04/30/17 0001      Lumbar Exercises: Stretches   Single Knee to Chest Stretch  2 reps;30 seconds      Lumbar Exercises: Supine   Clam  20 reps    Bent Knee Raise  10 reps    Bridge  --    Bridge with [employer]  10 reps    Other Supine Lumbar Exercises  used ball under sacrum for stabilization challenge       Lumbar Exercises: Prone   Other Prone Lumbar Exercises  prone pelvic press series x 10       Modalities   Modalities  Cryotherapy      Cryotherapy   Number Minutes Cryotherapy  10 Minutes    Cryotherapy Location  Lumbar Spine    Type of Cryotherapy  Ice pack      Manual Therapy   Manual Therapy  Joint mobilization;Soft tissue mobilization    Joint Mobilization  SIJ bilaterally, rotational mobs, hips bilaterall ER and IR with compression to piriformis     Soft tissue mobilization  piriformis               PT Education - 04/30/17 0814    Education provided  Yes    Education Details  stabilization, ice for more acute pain     Person(s) Educated  Patient    Methods  Explanation    Comprehension  Verbalized understanding          PT Long Term Goals - 04/30/17 0814      PT LONG TERM GOAL #1   Title  Pt will be I with HEP for core stabilty, hips and trunk flexibility     Status  On-going      PT LONG TERM GOAL #2   Title  Pt will be able to demo safe lifting technique for 20-30 lbs from the floor without exacerbating back pain     Status  On-going      PT LONG TERM GOAL #3   Title  Pt will sit as long as she needs to comfortably for meals, recreation, work.     Status  On-going  PT LONG TERM GOAL #4   Title  Pt will improve FOTO to less than 28% to demo improved functional mobility.     Status  On-going      PT LONG TERM GOAL #5   Title  Pt will return to light running without pain increase for up to mile.     Status  On-going            Plan - 04/30/17 0841    Clinical Impression Statement  Pt able to perform stability exercises in all positions.  Did have some reproduction of symptoms with manual pressure to lateral SIJ, sharp in nature.  Offered ice to calm sx.      PT Treatment/Interventions  ADLs/Self Care Home Management;Electrical Stimulation;Functional mobility training;Neuromuscular re-education;Taping;Therapeutic activities;Iontophoresis 4mg /ml Dexamethasone;Moist Heat;Traction;Cryotherapy;Ultrasound;Therapeutic exercise;Patient/family education;Dry needling;Manual techniques    PT Next Visit Plan  check HEP, begin stabilization (prone progression ), MHP, posture, lifting as able.     PT Home Exercise Plan  knee to chest, bent knee level 1 , Tr A ab progression (prone)     Consulted and Agree with Plan of Care  Patient       Patient will benefit from skilled therapeutic intervention in order to improve the following deficits and impairments:   Decreased mobility, Increased muscle spasms, Hypermobility, Decreased strength, Increased fascial restricitons, Pain  Visit Diagnosis: Acute midline low back pain without sciatica  Cramp and spasm  Muscle weakness (generalized)     Problem List Patient Active Problem List   Diagnosis Date Noted  . Near syncope 07/14/2014    PAA,JENNIFER 04/30/2017, 11:52 AM  [facility] [address] [place], [place], [address] Phone: [phone]   Fax:  [phone]  Name: Tammy Dorsey MRN: [id number] Date of Birth: [birth year]  Raeford Razor, PT 04/30/17 11:52 AM Phone: [phone] Fax: [phone]

## 2017-05-01 ENCOUNTER — Ambulatory Visit: Payer: 59 | Admitting: Physical Therapy

## 2017-05-07 ENCOUNTER — Ambulatory Visit: Payer: 59 | Admitting: Physical Therapy

## 2017-05-07 DIAGNOSIS — D2261 Melanocytic nevi of right upper limb, including shoulder: Secondary | ICD-10-CM | POA: Diagnosis not present

## 2017-05-07 DIAGNOSIS — R252 Cramp and spasm: Secondary | ICD-10-CM

## 2017-05-07 DIAGNOSIS — M545 Low back pain, unspecified: Secondary | ICD-10-CM

## 2017-05-07 DIAGNOSIS — M6281 Muscle weakness (generalized): Secondary | ICD-10-CM

## 2017-05-07 DIAGNOSIS — D225 Melanocytic nevi of trunk: Secondary | ICD-10-CM | POA: Diagnosis not present

## 2017-05-07 NOTE — Therapy (Signed)
[place], [place], [address] Phone: [phone]   Fax:  [phone]  Physical Therapy Treatment  Patient Details  Name: Tammy Dorsey MRN: [id number] Date of Birth: Jun 07, 1980 Referring Provider: Dr. Jenna Luo    Encounter Date: 05/07/2017  PT End of Session - 05/07/17 0939    Visit Number  3    Number of Visits  12    Date for PT Re-Evaluation  06/04/17    PT Start Time  0932    PT Stop Time  1023    PT Time Calculation (min)  51 min    Activity Tolerance  Patient tolerated treatment well    Behavior During Therapy  [facility] for tasks assessed/performed       No past medical history on file.  No past surgical history on file.  There were no vitals filed for this visit.  Subjective Assessment - 05/07/17 0936    Subjective  Was really hurting after last time.  Maybe it was the ball under my back.  It is back down to a 4/10    Currently in Pain?  Yes    Pain Score  4           [facility] Adult PT Treatment/Exercise - 05/07/17 0001      Lumbar Exercises: Stretches   Active Hamstring Stretch  2 reps;30 seconds    Single Knee to Chest Stretch  2 reps;30 seconds    Lower Trunk Rotation  10 seconds x 10       Lumbar Exercises: Standing   Other Standing Lumbar Exercises  Paloff press blue band x 10 each side, then added rotation x 10       Lumbar Exercises: Prone   Other Prone Lumbar Exercises  prone pelvic press series x 10 : iso Tr A, prone knee bend, prone hip extension, prone hip ext bent knee       Lumbar Exercises: Quadruped   [place]/Old Horse  10 reps    Straight Leg Raise  5 reps    Opposite Arm/Leg Raise  Right arm/Left leg;Left arm/Right leg;5 reps    Other Quadruped Lumbar Exercises  childs pose FW and lateral       Moist Heat Therapy   Number Minutes Moist Heat  15 Minutes    Moist Heat Location  Lumbar Spine      Electrical Stimulation   Electrical Stimulation Location   Lumbar R>L     Electrical Stimulation Action  IFC     Electrical Stimulation Parameters  to tol (10)     Electrical Stimulation Goals  Pain             PT Education - 05/07/17 0948    Education provided  Yes    Education Details  Prone/quad HEP and IFC for pain control     Person(s) Educated  Patient    Methods  Explanation    Comprehension  Verbalized understanding          PT Long Term Goals - 04/30/17 2202      PT LONG TERM GOAL #1   Title  Pt will be I with HEP for core stabilty, hips and trunk flexibility     Status  On-going      PT LONG TERM GOAL #2   Title  Pt will be able to demo safe lifting technique for 20-30 lbs from the floor without exacerbating back pain     Status  On-going  PT LONG TERM GOAL #3   Title  Pt will sit as long as she needs to comfortably for meals, recreation, work.     Status  On-going      PT LONG TERM GOAL #4   Title  Pt will improve FOTO to less than 28% to demo improved functional mobility.     Status  On-going      PT LONG TERM GOAL #5   Title  Pt will return to light running without pain increase for up to mile.     Status  On-going            Plan - 05/07/17 5035    Clinical Impression Statement  Pain increased after last session.  Took care today to ensure proper form, trial of IFC with heat for pain relief. Gave her the option to do bird dog and childs pose     PT Treatment/Interventions  ADLs/Self Care Home Management;Electrical Stimulation;Functional mobility training;Neuromuscular re-education;Taping;Therapeutic activities;Iontophoresis 4mg /ml Dexamethasone;Moist Heat;Traction;Cryotherapy;Ultrasound;Therapeutic exercise;Patient/family education;Dry needling;Manual techniques    PT Next Visit Plan  progress stabilization (prone progression ), multifidus , try Reformer,  how did IFC feel, posture, lifting as able.     PT Home Exercise Plan  knee to chest, bent knee level 1 , Tr A ab progression (prone)      Consulted and Agree with Plan of Care  Patient       Patient will benefit from skilled therapeutic intervention in order to improve the following deficits and impairments:  Decreased mobility, Increased muscle spasms, Hypermobility, Decreased strength, Increased fascial restricitons, Pain  Visit Diagnosis: Acute midline low back pain without sciatica  Cramp and spasm  Muscle weakness (generalized)     Problem List Patient Active Problem List   Diagnosis Date Noted  . Near syncope 07/14/2014    Tammy Dorsey 05/07/2017, 10:14 AM  [place], [place], [address] Phone: [phone]   Fax:  [phone]  Name: Tammy Dorsey MRN: [id number] Date of Birth: Feb 07, 1980  Raeford Razor, PT 05/07/17 10:14 AM Phone: [phone] Fax: [phone]

## 2017-05-09 ENCOUNTER — Ambulatory Visit: Payer: 59 | Admitting: Physical Therapy

## 2017-05-14 ENCOUNTER — Ambulatory Visit: Payer: 59 | Admitting: Physical Therapy

## 2017-05-21 ENCOUNTER — Encounter: Payer: Self-pay | Admitting: Physical Therapy

## 2017-05-21 ENCOUNTER — Ambulatory Visit: Payer: 59 | Admitting: Physical Therapy

## 2017-05-21 DIAGNOSIS — M545 Low back pain, unspecified: Secondary | ICD-10-CM

## 2017-05-21 DIAGNOSIS — M6281 Muscle weakness (generalized): Secondary | ICD-10-CM

## 2017-05-21 DIAGNOSIS — R252 Cramp and spasm: Secondary | ICD-10-CM

## 2017-05-21 NOTE — Patient Instructions (Signed)
Access Code: [email or web address]  URL: [email or web address]  Date: 05/21/2017  Prepared by: Raeford Razor   Exercises  Clamshell - 10 reps - 2 sets - 5 hold - 1x daily - 7x weekly  Supine Piriformis Stretch - 3 reps - 1 sets - 30 hold - 1x daily - 7x weekly

## 2017-05-21 NOTE — Therapy (Signed)
[place], [place], [address] Phone: [phone]   Fax:  [phone]  Physical Therapy Treatment  Patient Details  Name: Tammy Dorsey MRN: [id number] Date of Birth: [birth year] Referring Provider: Dr. Jenna Luo    Encounter Date: 05/21/2017  PT End of Session - 05/21/17 0938    Visit Number  4    Number of Visits  12    Date for PT Re-Evaluation  06/04/17    PT Start Time  0930    PT Stop Time  1028    PT Time Calculation (min)  58 min    Activity Tolerance  Patient tolerated treatment well;Other (comment) min incr back pain     Behavior During Therapy  [facility] for tasks assessed/performed       History reviewed. No pertinent past medical history.  History reviewed. No pertinent surgical history.  There were no vitals filed for this visit.  Subjective Assessment - 05/21/17 0932    Subjective  No pain right now.  Had some downtime so my back was really good.  But once I started back to work I felt it.      Currently in Pain?  No/denies        [facility] Adult PT Treatment/Exercise - 05/21/17 0001      Pilates   Pilates Reformer  Footwork 2 Red 1 Blue and Feet in sTraps 1 red 1 yellow see note       Lumbar Exercises: Stretches   Single Knee to Chest Stretch  3 reps;30 seconds    Hip Flexor Stretch  3 reps    Piriformis Stretch  3 reps;20 seconds pain with hip ER       Lumbar Exercises: Supine   Bent Knee Raise  15 reps from table top       Lumbar Exercises: Sidelying   Clam  Both;20 reps    Clam Limitations  pain , multiple breaks on R     Hip Abduction  Both;10 reps      Moist Heat Therapy   Number Minutes Moist Heat  15 Minutes    Moist Heat Location  Lumbar Spine      Electrical Stimulation   Electrical Stimulation Location  LS spine     Electrical Stimulation Action  IFC     Electrical Stimulation Parameters  9.5     Electrical Stimulation Goals  Pain      Manual Therapy   Manual therapy comments  LAD Rt LE x 3 hip ER and abd     Joint Mobilization  post capsule stretch gentle x 1 min        Pilates Reformer used for LE/core strength, postural strength, lumbopelvic disassociation and core control.  Exercises included:  Footwork double leg press on  heels , single leg x 12 same spring, cues for maintaining level pelvis  ,  Rt back pain with Rt LE press out  Feet in Straps   Arcs in parallel, turn out, turn in x 10 each small ROM to maintain control, and Squats x 10 good technique      PT Education - 05/21/17 1045    Education provided  Yes    Education Details  Reformer, piriformis     Person(s) Educated  Patient    Methods  Explanation;Verbal cues    Comprehension  Verbalized understanding;Verbal cues required;Need further instruction          PT Long Term Goals - 05/21/17 1048  PT LONG TERM GOAL #1   Title  Pt will be I with HEP for core stabilty, hips and trunk flexibility     Status  On-going      PT LONG TERM GOAL #2   Title  Pt will be able to demo safe lifting technique for 20-30 lbs from the floor without exacerbating back pain     Status  On-going      PT LONG TERM GOAL #3   Title  Pt will sit as long as she needs to comfortably for meals, recreation, work.     Status  On-going      PT LONG TERM GOAL #4   Title  Pt will improve FOTO to less than 28% to demo improved functional mobility.     Status  On-going      PT LONG TERM GOAL #5   Title  Pt will return to light running without pain increase for up to mile.     Status  On-going            Plan - 05/21/17 0940    Clinical Impression Statement  Initial mat exercises increased pain.  Rt hip painful in ER.  Weaker in Rt hip ER.  Single leg work on Reformer began to increase back pain further.  She did have relief with IFC and heat.      PT Treatment/Interventions  ADLs/Self Care Home Management;Electrical Stimulation;Functional mobility training;Neuromuscular  re-education;Taping;Therapeutic activities;Iontophoresis 4mg /ml Dexamethasone;Moist Heat;Traction;Cryotherapy;Ultrasound;Therapeutic exercise;Patient/family education;Dry needling;Manual techniques    PT Next Visit Plan  progress stabilization (lower ab, prone progression ), multifidus , try Reformer,  posture, lifting as able (hip hinge) , manual to Rt hip (ant capsule stretch), piriformis , consider ionto and [place]    PT Home Exercise Plan  knee to chest, bent knee level 1 , Tr A ab progression (prone)     Consulted and Agree with Plan of Care  Patient       Patient will benefit from skilled therapeutic intervention in order to improve the following deficits and impairments:  Decreased mobility, Increased muscle spasms, Hypermobility, Decreased strength, Increased fascial restricitons, Pain  Visit Diagnosis: Acute midline low back pain without sciatica  Cramp and spasm  Muscle weakness (generalized)     Problem List Patient Active Problem List   Diagnosis Date Noted  . Near syncope 07/14/2014    Umberto Pavek 05/21/2017, 11:12 AM  [place], [place], [address] Phone: [phone]   Fax:  [phone]  Name: Tammy Dorsey MRN: [id number] Date of Birth: [birth year]   Raeford Razor, PT 05/21/17 11:12 AM Phone: [phone] Fax: [phone]

## 2017-05-23 ENCOUNTER — Encounter: Payer: Self-pay | Admitting: Physical Therapy

## 2017-05-23 ENCOUNTER — Ambulatory Visit: Payer: 59 | Attending: Family Medicine | Admitting: Physical Therapy

## 2017-05-23 DIAGNOSIS — M6281 Muscle weakness (generalized): Secondary | ICD-10-CM | POA: Insufficient documentation

## 2017-05-23 DIAGNOSIS — R252 Cramp and spasm: Secondary | ICD-10-CM | POA: Insufficient documentation

## 2017-05-23 DIAGNOSIS — M545 Low back pain, unspecified: Secondary | ICD-10-CM

## 2017-05-23 NOTE — Therapy (Signed)
[place], [place], [address] Phone: [phone]   Fax:  [phone]  Physical Therapy Treatment  Patient Details  Name: Tammy Dorsey MRN: [id number] Date of Birth: 1980/02/16 Referring Provider: Dr. Jenna Luo    Encounter Date: 05/23/2017  PT End of Session - 05/23/17 0858    Visit Number  5    Number of Visits  12    Date for PT Re-Evaluation  06/04/17    PT Start Time  0845    PT Stop Time  0925    PT Time Calculation (min)  40 min       History reviewed. No pertinent past medical history.  History reviewed. No pertinent surgical history.  There were no vitals filed for this visit.  Subjective Assessment - 05/23/17 0849    Subjective  hip and back 3/10. More soreness in the hip.     Currently in Pain?  Yes    Pain Score  3     Pain Location  Back    Pain Orientation  Mid;Lower                       [facility] Adult PT Treatment/Exercise - 05/23/17 0001      Lumbar Exercises: Stretches   Active Hamstring Stretch  2 reps;30 seconds    Double Knee to Chest Stretch  30 seconds    Hip Flexor Stretch  3 reps    ITB Stretch  3 reps;30 seconds supine with strap and side lying      Lumbar Exercises: Supine   Clam  20 reps    Clam Limitations  red band bilateral and unilateral- smaller ROM to avoid pain.     Bridge with [employer]  10 reps    Bridge with clamshell  10 reps    Other Supine Lumbar Exercises  ball squeeze with Tra contract       Modalities   Modalities  Iontophoresis      Iontophoresis   Type of Iontophoresis  Dexamethasone    Location  R anterior hip    Dose  1 ml    Time  6 hours       Manual Therapy   Joint Mobilization  LAD and A/P Rt hip mobs, no change in pain after     Soft tissue mobilization  TFL, ITB, hip flexor supine and sidelying                   PT Long Term Goals - 05/21/17 1048      PT LONG TERM GOAL #1   Title  Pt  will be I with HEP for core stabilty, hips and trunk flexibility     Status  On-going      PT LONG TERM GOAL #2   Title  Pt will be able to demo safe lifting technique for 20-30 lbs from the floor without exacerbating back pain     Status  On-going      PT LONG TERM GOAL #3   Title  Pt will sit as long as she needs to comfortably for meals, recreation, work.     Status  On-going      PT LONG TERM GOAL #4   Title  Pt will improve FOTO to less than 28% to demo improved functional mobility.     Status  On-going      PT LONG TERM GOAL #5   Title  Pt will return to light running without pain increase for up to mile.     Status  On-going            Plan - 05/23/17 0857    Clinical Impression Statement  Pt reports more anterior right hip soreness. She has not performed side clam due to pain. Performed hip/core stabilization avoiding pain by limiting ROM. Right hip mobs and Rt hip soft tisuue work performed. Trial of Ionto to anterior hip. No increased pain at end of session.     PT Next Visit Plan  assess ionto; progress stabilization (lower ab, prone progression ), multifidus , try Reformer,  posture, lifting as able (hip hinge) , manual to Rt hip (ant capsule stretch), piriformis , consider ionto and [place]    PT Home Exercise Plan  knee to chest, bent knee level 1 , Tr A ab progression (prone) , side clam and piriformis stretch    Consulted and Agree with Plan of Care  Patient       Patient will benefit from skilled therapeutic intervention in order to improve the following deficits and impairments:  Decreased mobility, Increased muscle spasms, Hypermobility, Decreased strength, Increased fascial restricitons, Pain  Visit Diagnosis: Acute midline low back pain without sciatica  Cramp and spasm  Muscle weakness (generalized)     Problem List Patient Active Problem List   Diagnosis Date Noted  . Near syncope 07/14/2014    Dorene Ar, PTA 05/23/2017, 9:33 AM  [facility] [address] [place], [place], [address] Phone: [phone]   Fax:  [phone]  Name: Tammy Dorsey MRN: [id number] Date of Birth: May 11, 1980

## 2017-05-28 ENCOUNTER — Ambulatory Visit: Payer: 59 | Admitting: Physical Therapy

## 2017-05-28 ENCOUNTER — Encounter: Payer: Self-pay | Admitting: Physical Therapy

## 2017-05-28 DIAGNOSIS — R252 Cramp and spasm: Secondary | ICD-10-CM

## 2017-05-28 DIAGNOSIS — M545 Low back pain, unspecified: Secondary | ICD-10-CM

## 2017-05-28 DIAGNOSIS — M6281 Muscle weakness (generalized): Secondary | ICD-10-CM

## 2017-05-28 NOTE — Therapy (Signed)
[place], [place], [address] Phone: [phone]   Fax:  [phone]  Physical Therapy Treatment  Patient Details  Name: Tammy Dorsey MRN: [id number] Date of Birth: 02-Oct-1980 Referring Provider: Dr. Jenna Luo    Encounter Date: 05/28/2017  PT End of Session - 05/28/17 0850    Visit Number  6    Number of Visits  12    Date for PT Re-Evaluation  06/04/17    PT Start Time  0845    PT Stop Time  0945    PT Time Calculation (min)  60 min       History reviewed. No pertinent past medical history.  History reviewed. No pertinent surgical history.  There were no vitals filed for this visit.  Subjective Assessment - 05/28/17 0848    Subjective  I am very sore. Focused stretching. Had a very busy week. Low back pain and right hip pain continue.     Currently in Pain?  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     Bridge with clamshell  10 reps      Lumbar Exercises: Prone   Other Prone Lumbar Exercises  prone pelvic press series x 10 : iso Tr A, prone knee bend, prone hip extension, prone hip ext bent knee       Moist Heat Therapy   Number Minutes Moist Heat  15 Minutes    Moist Heat Location  Lumbar Spine      Electrical Stimulation   Electrical Stimulation Location  LS spine     Electrical Stimulation Action  IFC    Electrical Stimulation Parameters  59ma    Electrical Stimulation Goals  Pain      Manual Therapy   Joint Mobilization  LAD and A/P Rt hip mobs, no change in pain after , PROM knee to chest, ER, IR, hamstring stretch.     Soft tissue mobilization  Right glut med              PT Education - 05/28/17 [phone]    Education provided  Yes    Education Details  TENS unit information     Person(s) Educated  Patient    Methods  Explanation;Handout    Comprehension  Verbalized understanding          PT Long Term Goals - 05/21/17 1048      PT LONG TERM GOAL #1   Title  Pt will be I with HEP for core stabilty, hips and trunk flexibility     Status  On-going      PT LONG TERM GOAL #2   Title  Pt will be able to demo safe lifting technique for 20-30 lbs from the floor without exacerbating back pain     Status  On-going      PT LONG TERM GOAL #3   Title  Pt will sit as long as she needs to comfortably for meals, recreation, work.     Status  On-going      PT LONG TERM GOAL #4   Title  Pt will improve FOTO to less than 28% to demo improved functional mobility.     Status  On-going      PT LONG TERM GOAL #5   Title  Pt will return to light running without pain increase for up to mile.     Status  On-going            Plan - 05/28/17 5361    Clinical Impression Statement  Pt reports overall no real change in pain since beginning PT. Pt reports a busy week which has caused her to have more LBP and Hip pain. She describes it as constant. She has mild soreness in right glute med. Her hip pain is described as deep. It is reproduced with hip flexion and ER and does not change after hip mobilizations. She did not see benefit from Ionto. Pt given info about how to purchase TENS online.     PT Next Visit Plan  Possible DC next visit; Check goals, FOTO ;  progress stabilization (lower ab, prone progression ), multifidus , try Reformer,  posture, lifting as able (hip hinge) , manual to Rt  hip (ant capsule stretch), piriformis , consider ionto and [place]    PT Home Exercise Plan  knee to chest, bent knee level 1 , Tr A ab progression (prone) , side clam and piriformis stretch    Consulted and Agree with Plan of Care  Patient       Patient will benefit from skilled therapeutic intervention in order to improve the following deficits and impairments:  Decreased mobility, Increased muscle spasms, Hypermobility, Decreased strength, Increased fascial restricitons, Pain  Visit Diagnosis: Acute midline low back pain without sciatica  Cramp and spasm  Muscle weakness (generalized)     Problem List Patient Active Problem List   Diagnosis Date Noted  . Near syncope 07/14/2014    Dorene Ar, PTA 05/28/2017, 9:50 AM  [facility] [address] [place], [place], [address] Phone: [phone]   Fax:  [phone]  Name: Tammy Dorsey MRN: [id number] Date of Birth: [birth year]

## 2017-05-30 ENCOUNTER — Ambulatory Visit: Payer: 59 | Admitting: Physical Therapy

## 2017-05-30 DIAGNOSIS — M545 Low back pain, unspecified: Secondary | ICD-10-CM

## 2017-05-30 DIAGNOSIS — M6281 Muscle weakness (generalized): Secondary | ICD-10-CM

## 2017-05-30 DIAGNOSIS — R252 Cramp and spasm: Secondary | ICD-10-CM

## 2017-05-30 NOTE — Therapy (Signed)
[place], [place], [address] Phone: [phone]   Fax:  [phone]  Physical Therapy Treatment/Discharge   Patient Details  Name: Tammy Dorsey MRN: [id number] Date of Birth: 11/14/1980 Referring Provider: Dr. Jenna Luo    Encounter Date: 05/30/2017  PT End of Session - 05/30/17 1116    Visit Number  7    Number of Visits  12    Date for PT Re-Evaluation  06/04/17    PT Start Time  1108    PT Stop Time  1146    PT Time Calculation (min)  38 min    Activity Tolerance  Patient tolerated treatment well;Other (comment)    Behavior During Therapy  WFL for tasks assessed/performed       No past medical history on file.  No past surgical history on file.  There were no vitals filed for this visit.  Subjective Assessment - 05/30/17 1112    Subjective  Hip pain is very limiting.  No better overall functionally.      Currently in Pain?  Yes    Pain Score  6     Pain Location  Back         [facility] PT Assessment - 05/30/17 0001      Observation/Other Assessments   Focus on Therapeutic Outcomes (FOTO)   42%      Strength   Right Hip Flexion  5/5    Right Hip Extension  4+/5    Right Hip ABduction  5/5    Left Hip Flexion  5/5    Left Hip Extension  4+/5          [facility] Adult PT Treatment/Exercise - 05/30/17 0001      Lumbar Exercises: Supine   Bridge  10 reps    Single Leg Bridge  10 reps when pushing up with LLE, Rt back pain incr       Lumbar Exercises: Sidelying   Clam  Both;20 reps      Moist Heat Therapy   Number Minutes Moist Heat  15 Minutes    Moist Heat Location  Lumbar Spine;Hip      Electrical Stimulation   Electrical Stimulation Location  Rt. hip     Electrical Stimulation Action  IFC    Electrical Stimulation Parameters  11    Electrical Stimulation Goals  Pain      Manual Therapy   Manual therapy comments  quad stretch prone , FABER test pos Rt ant hip     Joint Mobilization  Rt hip ER and IR PROM                   PT Long Term Goals - 05/30/17 1135      PT LONG TERM GOAL #1   Title  Pt will be I with HEP for core stabilty, hips and trunk flexibility     Status  Achieved      PT LONG TERM GOAL #2   Title  Pt will be able to demo safe lifting technique for 20-30 lbs from the floor without exacerbating back pain     Baseline  did not get to do, limited due to pain     Status  Unable to assess      PT LONG TERM GOAL #3   Title  Pt will sit as long as she needs to comfortably for meals, recreation, work.     Status  Not Met  PT LONG TERM GOAL #4   Title  Pt will improve FOTO to less than 28% to demo improved functional mobility.       PT LONG TERM GOAL #5   Title  Pt will return to light running without pain increase for up to mile.     Status  Not Met            Plan - 05/30/17 1136    Clinical Impression Statement  Patient with 6/10 pain in back and Rt hip, groin.  She reports no relief with exercises or manual. She is stronger in hip abduction and extension.  Pain with FABER.  She will benefit from further imaging and /or orthopedic referral for Rt hip.     PT Next Visit Plan  NA, DC from PT     PT Home Exercise Plan  knee to chest, bent knee level 1 , Tr A ab progression (prone) , side clam and piriformis stretch    Consulted and Agree with Plan of Care  Patient       Patient will benefit from skilled therapeutic intervention in order to improve the following deficits and impairments:     Visit Diagnosis: Acute midline low back pain without sciatica  Cramp and spasm  Muscle weakness (generalized)     Problem List Patient Active Problem List   Diagnosis Date Noted  . Near syncope 07/14/2014    PAA,JENNIFER 05/30/2017, 11:44 AM  [facility] [address] [place], [place], [address] Phone: [phone]   Fax:  [phone]  Name: LEISA GAULT MRN: [id number] Date of Birth: Jun 09, 1980   Raeford Razor, PT 05/30/17 11:44 AM Phone: [phone] Fax: [phone]   PHYSICAL THERAPY DISCHARGE SUMMARY  Visits from Start of Care: 7  Current functional level related to goals / functional outcomes: Cont to be limited with sitting, standing, squatting, Rt hip and low back pain    Remaining deficits: Rt hip pain, ROM   Education / Equipment: HEP, hip anatomy, stabilization, RICE , Home TENS, orthopedic referral Plan: Patient agrees to discharge.  Patient goals were not met. Patient is being discharged due to the physician's request.  ?????    Raeford Razor, PT 05/30/17 11:45 AM Phone: [phone] Fax: [phone]

## 2017-05-31 ENCOUNTER — Telehealth: Payer: Self-pay | Admitting: Family Medicine

## 2017-05-31 DIAGNOSIS — M545 Low back pain, unspecified: Secondary | ICD-10-CM

## 2017-05-31 NOTE — Telephone Encounter (Signed)
I am fine with orthopedic consultation

## 2017-05-31 NOTE — Telephone Encounter (Signed)
Patient called Tammy Dorsey stating that her back pain is no better after PT and would like to know if we can send her to ortho or does she need to come back in to see [place] first??

## 2017-05-31 NOTE — Telephone Encounter (Signed)
Referral placed and pt aware   

## 2017-06-06 ENCOUNTER — Other Ambulatory Visit (INDEPENDENT_AMBULATORY_CARE_PROVIDER_SITE_OTHER): Payer: Self-pay | Admitting: Radiology

## 2017-06-06 ENCOUNTER — Encounter (INDEPENDENT_AMBULATORY_CARE_PROVIDER_SITE_OTHER): Payer: Self-pay | Admitting: Orthopaedic Surgery

## 2017-06-06 ENCOUNTER — Ambulatory Visit (INDEPENDENT_AMBULATORY_CARE_PROVIDER_SITE_OTHER): Payer: 59 | Admitting: Orthopaedic Surgery

## 2017-06-06 ENCOUNTER — Ambulatory Visit (INDEPENDENT_AMBULATORY_CARE_PROVIDER_SITE_OTHER): Payer: Self-pay

## 2017-06-06 VITALS — BP 114/80 | HR 65 | Resp 18 | Ht 66.0 in | Wt 173.0 lb

## 2017-06-06 DIAGNOSIS — M25551 Pain in right hip: Secondary | ICD-10-CM

## 2017-06-06 DIAGNOSIS — G8929 Other chronic pain: Secondary | ICD-10-CM | POA: Diagnosis not present

## 2017-06-06 DIAGNOSIS — M5441 Lumbago with sciatica, right side: Secondary | ICD-10-CM | POA: Diagnosis not present
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
## 2017-06-20 ENCOUNTER — Ambulatory Visit
Admission: RE | Admit: 2017-06-20 | Discharge: 2017-06-20 | Disposition: A | Discharge status: Home / Self Care | Payer: 59 | Source: Ambulatory Visit | Attending: Orthopaedic Surgery | Admitting: Orthopaedic Surgery

## 2017-06-20 DIAGNOSIS — M25551 Pain in right hip: Secondary | ICD-10-CM

## 2017-06-20 MED ORDER — IOPAMIDOL (ISOVUE-M 200) INJECTION 41%
20.0000 mL | Freq: Once | INTRAMUSCULAR | Status: AC
  Administered 2017-06-20: 20 mL via INTRA_ARTICULAR

## 2017-06-24 ENCOUNTER — Ambulatory Visit (INDEPENDENT_AMBULATORY_CARE_PROVIDER_SITE_OTHER): Payer: 59 | Admitting: Orthopaedic Surgery

## 2017-06-24 ENCOUNTER — Other Ambulatory Visit (INDEPENDENT_AMBULATORY_CARE_PROVIDER_SITE_OTHER): Payer: Self-pay | Admitting: Radiology

## 2017-06-24 ENCOUNTER — Encounter (INDEPENDENT_AMBULATORY_CARE_PROVIDER_SITE_OTHER): Payer: Self-pay | Admitting: Orthopaedic Surgery

## 2017-06-24 VITALS — BP 111/74 | HR 66 | Ht 66.5 in | Wt 173.0 lb

## 2017-06-24 DIAGNOSIS — M25551 Pain in right hip: Secondary | ICD-10-CM

## 2017-06-24 DIAGNOSIS — M25552 Pain in left hip: Secondary | ICD-10-CM

## 2017-06-24 NOTE — Progress Notes (Signed)
   Office Visit Note   Patient: Tammy Dorsey           Date of Birth: [birth year]           MRN: [id number] Visit Date: 06/24/2017              Requested by: Susy Frizzle, MD [address] [place], [place] [address] PCP: Susy Frizzle, MD   Assessment & Plan: Visit Diagnoses:  1. Pain of right hip joint     Plan: MRI arthrogram right hip demonstrates a superior anterior labral tear.  This certainly fits her clinical picture.  There was also a concern that some of her pain may referred from her back.  In view of the positive arthrogram I will ask Dr. Ernestina Patches to inject her hip with cortisone as both a diagnostic and therapeutic procedure.  Have [name] return in 1 month.  Long discussion over 25 minutes regarding the diagnosis and different treatment options.  Many questions which were answered.  Depending upon results of the injection Mrs. Shetterly may or may not be a candidate for arthroscopic repair  Follow-Up Instructions: Return in about 1 month (around 07/24/2017).   Orders:  No orders of the defined types were placed in this encounter.  No orders of the defined types were placed in this encounter.     Procedures: No procedures performed   Clinical Data: No additional findings.   Subjective: Chief Complaint  Patient presents with  . Right Hip - Pain  . Follow-up    MRI  REVIEW RIGHT HIP    HPI  Review of Systems   Objective: Vital Signs: BP 111/74 (BP Location: Left Arm, Patient Position: Sitting, Cuff Size: Normal)   Pulse 66   Ht 5' 6.5" (1.689 m)   Wt 173 lb (78.5 kg)   LMP 05/26/2017 (Exact Date)   BMI 27.50 kg/m   Physical Exam  Ortho Exam awake alert and oriented x3.  Comfortable sitting.  No limp.  Pain is previously identified with internal/external rotation of her right hip increased with increased flexion popping or clicking.  Straight leg raise negative  Specialty Comments:  No specialty comments  available.  Imaging: No results found.   PMFS History: Patient Active Problem List   Diagnosis Date Noted  . Near syncope 07/14/2014   History reviewed. No pertinent past medical history.  Family History  Problem Relation Age of Onset  . Heart disease Maternal Uncle   . Early death Neg Hx     Past Surgical History:  Procedure Laterality Date  . CESAREAN SECTION    . WISDOM TOOTH EXTRACTION     Social History   Occupational History  . Not on file  Tobacco Use  . Smoking status: Never Smoker  . Smokeless tobacco: Never Used  Substance and Sexual Activity  . Alcohol use: Yes    Alcohol/week: 1.2 oz    Types: 2 Glasses of wine per week  . Drug use: No  . Sexual activity: Yes    Comment: Married, 2 kids     Tammy Balding, MD   Note - This record has been created using [employer].  Chart creation errors have been sought, but may not always  have been located. Such creation errors do not reflect on  the standard of medical care.

## 2017-06-24 NOTE — Progress Notes (Deleted)
   Office Visit Note   Patient: Tammy Dorsey           Date of Birth: Jun 03, 1980           MRN: [id number] Visit Date: 06/24/2017              Requested by: Susy Frizzle, MD [address] [place], [place] [address] PCP: Susy Frizzle, MD   Assessment & Plan: Visit Diagnoses: No diagnosis found.  Plan: ***  Follow-Up Instructions: No follow-ups on file.   Orders:  No orders of the defined types were placed in this encounter.  No orders of the defined types were placed in this encounter.     Procedures: No procedures performed   Clinical Data: No additional findings.   Subjective: No chief complaint on file.   HPI  Review of Systems  Constitutional: Negative for fatigue and fever.  HENT: Negative for ear pain.   Eyes: Negative for pain.  Respiratory: Negative for cough and shortness of breath.   Cardiovascular: Negative for leg swelling.  Gastrointestinal: Negative for constipation and diarrhea.  Genitourinary: Negative for difficulty urinating.  Musculoskeletal: Positive for neck pain. Negative for back pain.  Skin: Negative for rash.  Allergic/Immunologic: Negative for food allergies.  Neurological: Positive for weakness. Negative for numbness.  Hematological: Does not bruise/bleed easily.  Psychiatric/Behavioral: Positive for sleep disturbance.     Objective: Vital Signs: LMP 05/26/2017 (Exact Date)   Physical Exam  Ortho Exam  Specialty Comments:  No specialty comments available.  Imaging: No results found.   PMFS History: Patient Active Problem List   Diagnosis Date Noted  . Near syncope 07/14/2014   History reviewed. No pertinent past medical history.  Family History  Problem Relation Age of Onset  . Heart disease Maternal Uncle   . Early death Neg Hx     Past Surgical History:  Procedure Laterality Date  . CESAREAN SECTION    . WISDOM TOOTH EXTRACTION     Social History   Occupational History  . Not on  file  Tobacco Use  . Smoking status: Never Smoker  . Smokeless tobacco: Never Used  Substance and Sexual Activity  . Alcohol use: Yes    Alcohol/week: 1.2 oz    Types: 2 Glasses of wine per week  . Drug use: No  . Sexual activity: Yes    Comment: Married, 2 kids

## 2017-07-09 ENCOUNTER — Ambulatory Visit (INDEPENDENT_AMBULATORY_CARE_PROVIDER_SITE_OTHER): Payer: 59 | Admitting: Physical Medicine and Rehabilitation

## 2017-07-09 ENCOUNTER — Encounter (INDEPENDENT_AMBULATORY_CARE_PROVIDER_SITE_OTHER): Payer: Self-pay | Admitting: Physical Medicine and Rehabilitation

## 2017-07-09 ENCOUNTER — Ambulatory Visit (INDEPENDENT_AMBULATORY_CARE_PROVIDER_SITE_OTHER): Payer: Self-pay

## 2017-07-09 DIAGNOSIS — M25551 Pain in right hip: Secondary | ICD-10-CM | POA: Diagnosis not present

## 2017-07-09 NOTE — Progress Notes (Signed)
 .  Numeric Pain Rating Scale and Functional Assessment Average Pain 6   In the last MONTH (on 0-10 scale) has pain interfered with the following?  1. General activity like being  able to carry out your everyday physical activities such as walking, climbing stairs, carrying groceries, or moving a chair?  Rating(4)    -Dye Allergies.  

## 2017-07-09 NOTE — Patient Instructions (Signed)
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
## 2017-07-10 MED ORDER — BUPIVACAINE HCL 0.5 % IJ SOLN
3.0000 mL | INTRAMUSCULAR | Status: AC | PRN
  Administered 2017-07-09: 3 mL via INTRA_ARTICULAR

## 2017-07-10 MED ORDER — TRIAMCINOLONE ACETONIDE 40 MG/ML IJ SUSP
80.0000 mg | INTRAMUSCULAR | Status: AC | PRN
  Administered 2017-07-09: 80 mg via INTRA_ARTICULAR

## 2017-08-05 ENCOUNTER — Ambulatory Visit (INDEPENDENT_AMBULATORY_CARE_PROVIDER_SITE_OTHER): Payer: 59 | Admitting: Orthopaedic Surgery

## 2017-08-05 ENCOUNTER — Other Ambulatory Visit (INDEPENDENT_AMBULATORY_CARE_PROVIDER_SITE_OTHER): Payer: Self-pay | Admitting: Radiology

## 2017-08-05 ENCOUNTER — Encounter (INDEPENDENT_AMBULATORY_CARE_PROVIDER_SITE_OTHER): Payer: Self-pay | Admitting: Orthopaedic Surgery

## 2017-08-05 VITALS — BP 121/80 | HR 71 | Ht 66.5 in | Wt 173.0 lb

## 2017-08-05 DIAGNOSIS — M25551 Pain in right hip: Secondary | ICD-10-CM

## 2017-08-05 NOTE — Progress Notes (Signed)
Office Visit Note   Patient: Tammy Dorsey           Date of Birth: 05/16/1980           MRN: [id number] Visit Date: 08/05/2017              Requested by: Susy Frizzle, MD [address] [place], [place] [address] PCP: Susy Frizzle, MD   Assessment & Plan: Visit Diagnoses:  1. Pain of right hip joint     Plan: Anterior labral tear right hip.  Has had a course of physical therapy and an intra-articular cortisone injection.  Better but still having some compromise of activities and pain predominantly in the the right groin and anterior thigh.  After much discussion she would like to proceed with evaluation at [facility] with Dr. Aretha Parrot we will call and make referral  Follow-Up Instructions: Return if symptoms worsen or fail to improve.   Orders:  No orders of the defined types were placed in this encounter.  No orders of the defined types were placed in this encounter.     Procedures: No procedures performed   Clinical Data: No additional findings.   Subjective: Chief Complaint  Patient presents with  . Follow-up    07/09/17 HAD INJECTION W DR NEWTON PAIN IS STILL PRESENT AND UNABLE TO LAY ON THAT SIDE, HAS A LITTLE MORE ROM   Tammy Dorsey follows up today for further issues she is experiencing with her right hip.  As previously identified she has a tear of the anterior labrum diagnosed via MRI with contrast.  The tear was located on the superior anterior labrum.  She is had a course of physical therapy.  Within the last month she is had an intra-articular cortisone injection with Dr. Ernestina Patches.  She notes that she has a little bit better range of motion and probably a little less pain but still in a "a nuisance discomfort".  The pain is localized along the anterior groin with referred discomfort into the anterior thigh.  On occasion she will have some buttock pain or lateral hip pain.  She is not experiencing back pain today  HPI  Review of Systems   Constitutional: Negative for fatigue and fever.  HENT: Negative for ear pain.   Eyes: Negative for pain.  Respiratory: Negative for cough and shortness of breath.   Cardiovascular: Negative for leg swelling.  Gastrointestinal: Negative for constipation and diarrhea.  Genitourinary: Negative for difficulty urinating.  Musculoskeletal: Positive for back pain. Negative for neck pain.  Skin: Negative for rash.  Allergic/Immunologic: Negative for food allergies.  Neurological: Positive for weakness. Negative for numbness.  Hematological: Does not bruise/bleed easily.  Psychiatric/Behavioral: Positive for sleep disturbance.     Objective: Vital Signs: BP 121/80 (BP Location: Left Arm, Patient Position: Sitting, Cuff Size: Normal)   Pulse 71   Ht 5' 6.5" (1.689 m)   Wt 173 lb (78.5 kg)   BMI 27.50 kg/m   Physical Exam  Constitutional: She is oriented to person, place, and time. She appears well-developed and well-nourished.  HENT:  Mouth/Throat: Oropharynx is clear and moist.  Eyes: Pupils are equal, round, and reactive to light. EOM are normal.  Pulmonary/Chest: Effort normal.  Neurological: She is alert and oriented to person, place, and time.  Skin: Skin is warm and dry.  Psychiatric: She has a normal mood and affect. Her behavior is normal.    Ortho Exam awake alert and oriented x3.  Comfortable sitting.  Walks without a limp.  Still having some discomfort with internal/external rotation right hip and extreme flexion.  No popping or clicking.  Skin intact.  Neurovascular exam intact.  Straight leg raise negative.  Specialty Comments:  No specialty comments available.  Imaging: No results found.   PMFS History: Patient Active Problem List   Diagnosis Date Noted  . Near syncope 07/14/2014   History reviewed. No pertinent past medical history.  Family History  Problem Relation Age of Onset  . Heart disease Maternal Uncle   . Early death Neg Hx     Past Surgical  History:  Procedure Laterality Date  . CESAREAN SECTION    . WISDOM TOOTH EXTRACTION     Social History   Occupational History  . Not on file  Tobacco Use  . Smoking status: Never Smoker  . Smokeless tobacco: Never Used  Substance and Sexual Activity  . Alcohol use: Yes    Alcohol/week: 1.2 oz    Types: 2 Glasses of wine per week  . Drug use: No  . Sexual activity: Yes    Comment: Married, 2 kids

## 2017-08-07 ENCOUNTER — Encounter (INDEPENDENT_AMBULATORY_CARE_PROVIDER_SITE_OTHER): Payer: Self-pay | Admitting: *Deleted

## 2017-09-16 DIAGNOSIS — M25551 Pain in right hip: Secondary | ICD-10-CM | POA: Diagnosis not present

## 2017-10-08 ENCOUNTER — Ambulatory Visit: Payer: 59 | Admitting: Family Medicine

## 2017-10-08 ENCOUNTER — Encounter: Payer: Self-pay | Admitting: Family Medicine

## 2017-10-08 VITALS — BP 110/60 | HR 60 | Temp 98.1°F | Resp 16 | Ht 66.0 in | Wt 177.0 lb

## 2017-10-08 DIAGNOSIS — Z01818 Encounter for other preprocedural examination: Secondary | ICD-10-CM | POA: Diagnosis not present

## 2017-10-08 LAB — CBC WITH DIFFERENTIAL/PLATELET
BASOS PCT: 0.4 %
Basophils Absolute: 41 cells/uL (ref 0–200)
EOS PCT: 0.6 %
Eosinophils Absolute: 61 cells/uL (ref 15–500)
HEMATOCRIT: 41.6 % (ref 35.0–45.0)
HEMOGLOBIN: 14 g/dL (ref 11.7–15.5)
LYMPHS ABS: 2611 {cells}/uL (ref 850–3900)
MCH: 30 pg (ref 27.0–33.0)
MCHC: 33.7 g/dL (ref 32.0–36.0)
MCV: 89.3 fL (ref 80.0–100.0)
MPV: 10.6 fL (ref 7.5–12.5)
Monocytes Relative: 5.7 %
NEUTROS ABS: 6905 {cells}/uL (ref 1500–7800)
NEUTROS PCT: 67.7 %
Platelets: 266 10*3/uL (ref 140–400)
RBC: 4.66 10*6/uL (ref 3.80–5.10)
RDW: 11.6 % (ref 11.0–15.0)
Total Lymphocyte: 25.6 %
WBC: 10.2 10*3/uL (ref 3.8–10.8)
WBCMIX: 581 {cells}/uL (ref 200–950)

## 2017-10-08 LAB — COMPLETE METABOLIC PANEL WITH GFR
AG Ratio: 1.8 (calc) (ref 1.0–2.5)
ALBUMIN MSPROF: 4.3 g/dL (ref 3.6–5.1)
ALT: 19 U/L (ref 6–29)
AST: 17 U/L (ref 10–30)
Alkaline phosphatase (APISO): 48 U/L (ref 33–115)
BUN: 12 mg/dL (ref 7–25)
CALCIUM: 9.5 mg/dL (ref 8.6–10.2)
CO2: 27 mmol/L (ref 20–32)
CREATININE: 0.83 mg/dL (ref 0.50–1.10)
Chloride: 102 mmol/L (ref 98–110)
GFR, EST AFRICAN AMERICAN: 104 mL/min/{1.73_m2} (ref >=60)
GFR, EST NON AFRICAN AMERICAN: 90 mL/min/{1.73_m2} (ref >=60)
GLOBULIN: 2.4 g/dL (ref 1.9–3.7)
Glucose, Bld: 77 mg/dL (ref 65–99)
POTASSIUM: 4.4 mmol/L (ref 3.5–5.3)
SODIUM: 139 mmol/L (ref 135–146)
TOTAL PROTEIN: 6.7 g/dL (ref 6.1–8.1)
Total Bilirubin: 0.6 mg/dL (ref 0.2–1.2)
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
## 2017-10-09 ENCOUNTER — Encounter: Payer: Self-pay | Admitting: Family Medicine

## 2017-10-16 DIAGNOSIS — M25551 Pain in right hip: Secondary | ICD-10-CM | POA: Diagnosis not present

## 2017-10-17 DIAGNOSIS — M94251 Chondromalacia, right hip: Secondary | ICD-10-CM | POA: Diagnosis not present

## 2017-10-17 DIAGNOSIS — S73191A Other sprain of right hip, initial encounter: Secondary | ICD-10-CM | POA: Diagnosis not present

## 2017-10-17 DIAGNOSIS — M6588 Other synovitis and tenosynovitis, other site: Secondary | ICD-10-CM | POA: Diagnosis not present

## 2017-10-17 DIAGNOSIS — M25551 Pain in right hip: Secondary | ICD-10-CM | POA: Diagnosis not present

## 2017-10-21 DIAGNOSIS — M25551 Pain in right hip: Secondary | ICD-10-CM | POA: Diagnosis not present

## 2017-10-21 DIAGNOSIS — R262 Difficulty in walking, not elsewhere classified: Secondary | ICD-10-CM | POA: Diagnosis not present

## 2017-10-21 DIAGNOSIS — Z9889 Other specified postprocedural states: Secondary | ICD-10-CM | POA: Diagnosis not present

## 2017-10-28 DIAGNOSIS — M25551 Pain in right hip: Secondary | ICD-10-CM | POA: Diagnosis not present

## 2017-10-28 DIAGNOSIS — M25651 Stiffness of right hip, not elsewhere classified: Secondary | ICD-10-CM | POA: Diagnosis not present

## 2017-10-28 DIAGNOSIS — R29898 Other symptoms and signs involving the musculoskeletal system: Secondary | ICD-10-CM | POA: Diagnosis not present

## 2017-11-04 DIAGNOSIS — M25551 Pain in right hip: Secondary | ICD-10-CM | POA: Diagnosis not present

## 2017-11-04 DIAGNOSIS — R29898 Other symptoms and signs involving the musculoskeletal system: Secondary | ICD-10-CM | POA: Diagnosis not present

## 2017-11-04 DIAGNOSIS — M25651 Stiffness of right hip, not elsewhere classified: Secondary | ICD-10-CM | POA: Diagnosis not present

## 2017-11-12 DIAGNOSIS — R29898 Other symptoms and signs involving the musculoskeletal system: Secondary | ICD-10-CM | POA: Diagnosis not present

## 2017-11-12 DIAGNOSIS — M25551 Pain in right hip: Secondary | ICD-10-CM | POA: Diagnosis not present

## 2017-11-12 DIAGNOSIS — M25651 Stiffness of right hip, not elsewhere classified: Secondary | ICD-10-CM | POA: Diagnosis not present

## 2017-11-18 DIAGNOSIS — Z9889 Other specified postprocedural states: Secondary | ICD-10-CM | POA: Diagnosis not present

## 2017-11-18 DIAGNOSIS — R29898 Other symptoms and signs involving the musculoskeletal system: Secondary | ICD-10-CM | POA: Diagnosis not present

## 2017-11-18 DIAGNOSIS — M25551 Pain in right hip: Secondary | ICD-10-CM | POA: Diagnosis not present

## 2017-11-19 DIAGNOSIS — M160 Bilateral primary osteoarthritis of hip: Secondary | ICD-10-CM | POA: Diagnosis not present

## 2017-11-19 DIAGNOSIS — Z4789 Encounter for other orthopedic aftercare: Secondary | ICD-10-CM | POA: Diagnosis not present

## 2017-11-19 DIAGNOSIS — M25551 Pain in right hip: Secondary | ICD-10-CM | POA: Diagnosis not present

## 2017-11-25 DIAGNOSIS — R29898 Other symptoms and signs involving the musculoskeletal system: Secondary | ICD-10-CM | POA: Diagnosis not present

## 2017-11-25 DIAGNOSIS — Z9889 Other specified postprocedural states: Secondary | ICD-10-CM | POA: Diagnosis not present

## 2017-11-25 DIAGNOSIS — M25551 Pain in right hip: Secondary | ICD-10-CM | POA: Diagnosis not present

## 2017-12-03 DIAGNOSIS — M25551 Pain in right hip: Secondary | ICD-10-CM | POA: Diagnosis not present

## 2017-12-03 DIAGNOSIS — R29898 Other symptoms and signs involving the musculoskeletal system: Secondary | ICD-10-CM | POA: Diagnosis not present

## 2017-12-03 DIAGNOSIS — M25651 Stiffness of right hip, not elsewhere classified: Secondary | ICD-10-CM | POA: Diagnosis not present

## 2017-12-09 DIAGNOSIS — M25551 Pain in right hip: Secondary | ICD-10-CM | POA: Diagnosis not present

## 2017-12-09 DIAGNOSIS — R29898 Other symptoms and signs involving the musculoskeletal system: Secondary | ICD-10-CM | POA: Diagnosis not present

## 2017-12-09 DIAGNOSIS — M25651 Stiffness of right hip, not elsewhere classified: Secondary | ICD-10-CM | POA: Diagnosis not present

## 2017-12-24 DIAGNOSIS — M25651 Stiffness of right hip, not elsewhere classified: Secondary | ICD-10-CM | POA: Diagnosis not present

## 2017-12-24 DIAGNOSIS — R29898 Other symptoms and signs involving the musculoskeletal system: Secondary | ICD-10-CM | POA: Diagnosis not present

## 2017-12-24 DIAGNOSIS — Z9889 Other specified postprocedural states: Secondary | ICD-10-CM | POA: Diagnosis not present

## 2018-01-06 DIAGNOSIS — M25551 Pain in right hip: Secondary | ICD-10-CM | POA: Diagnosis not present

## 2018-01-06 DIAGNOSIS — R29898 Other symptoms and signs involving the musculoskeletal system: Secondary | ICD-10-CM | POA: Diagnosis not present

## 2018-01-06 DIAGNOSIS — Z9889 Other specified postprocedural states: Secondary | ICD-10-CM | POA: Diagnosis not present

## 2018-01-13 DIAGNOSIS — M25651 Stiffness of right hip, not elsewhere classified: Secondary | ICD-10-CM | POA: Diagnosis not present

## 2018-01-13 DIAGNOSIS — M25551 Pain in right hip: Secondary | ICD-10-CM | POA: Diagnosis not present

## 2018-01-13 DIAGNOSIS — R29898 Other symptoms and signs involving the musculoskeletal system: Secondary | ICD-10-CM | POA: Diagnosis not present

## 2018-01-20 DIAGNOSIS — R29898 Other symptoms and signs involving the musculoskeletal system: Secondary | ICD-10-CM | POA: Diagnosis not present

## 2018-01-20 DIAGNOSIS — M25551 Pain in right hip: Secondary | ICD-10-CM | POA: Diagnosis not present

## 2018-01-20 DIAGNOSIS — Z9889 Other specified postprocedural states: Secondary | ICD-10-CM | POA: Diagnosis not present

## 2018-02-18 DIAGNOSIS — Z9889 Other specified postprocedural states: Secondary | ICD-10-CM | POA: Diagnosis not present

## 2018-02-18 DIAGNOSIS — M25551 Pain in right hip: Secondary | ICD-10-CM | POA: Diagnosis not present

## 2018-02-23 DIAGNOSIS — J4521 Mild intermittent asthma with (acute) exacerbation: Secondary | ICD-10-CM | POA: Diagnosis not present

## 2018-02-25 DIAGNOSIS — Z6829 Body mass index (BMI) 29.0-29.9, adult: Secondary | ICD-10-CM | POA: Diagnosis not present

## 2018-02-25 DIAGNOSIS — Z01419 Encounter for gynecological examination (general) (routine) without abnormal findings: Secondary | ICD-10-CM | POA: Diagnosis not present

## 2019-04-02 ENCOUNTER — Ambulatory Visit: Payer: Self-pay | Attending: Internal Medicine

## 2019-04-02 DIAGNOSIS — Z23 Encounter for immunization: Secondary | ICD-10-CM

## 2019-04-02 NOTE — Progress Notes (Signed)
   Covid-19 Vaccination Clinic  Name:  Tammy Dorsey    MRN: [id number] DOB: [birth year]  04/02/2019  Tammy Dorsey was observed post Covid-19 immunization for 15 minutes without incident. She was provided with Vaccine Information Sheet and instruction to access the V-Safe system.   Tammy Dorsey was instructed to call 911 with any severe reactions post vaccine: Marland Kitchen Difficulty breathing  . Swelling of face and throat  . A fast heartbeat  . A bad rash all over body  . Dizziness and weakness   Immunizations Administered    Name Date Dose VIS Date Route   Pfizer COVID-19 Vaccine 04/02/2019 12:15 PM 0.3 mL 01/02/2019 Intramuscular   Manufacturer: [place]   Lot: [id number]   [place]: [id number]

## 2019-04-27 ENCOUNTER — Ambulatory Visit: Payer: Self-pay | Attending: Internal Medicine

## 2019-04-27 DIAGNOSIS — Z23 Encounter for immunization: Secondary | ICD-10-CM

## 2019-04-27 NOTE — Progress Notes (Signed)
   Covid-19 Vaccination Clinic  Name:  Tammy Dorsey    MRN: [id number] DOB: [birth year]  04/27/2019  [name] was observed post Covid-19 immunization for 15 minutes without incident. She was provided with Vaccine Information Sheet and instruction to access the V-Safe system.   [name] was instructed to call 911 with any severe reactions post vaccine: Marland Kitchen Difficulty breathing  . Swelling of face and throat  . A fast heartbeat  . A bad rash all over body  . Dizziness and weakness   Immunizations Administered    Name Date Dose VIS Date Route   Pfizer COVID-19 Vaccine 04/27/2019 11:58 AM 0.3 mL 01/02/2019 Intramuscular   Manufacturer: [place]   Lot: [id number]   [place]: [id number]

## 2020-01-07 ENCOUNTER — Encounter: Payer: Self-pay | Admitting: Nurse Practitioner

## 2020-01-07 ENCOUNTER — Other Ambulatory Visit: Payer: Self-pay

## 2020-01-07 ENCOUNTER — Telehealth (INDEPENDENT_AMBULATORY_CARE_PROVIDER_SITE_OTHER): Payer: Managed Care, Other (non HMO) | Admitting: Nurse Practitioner

## 2020-01-07 VITALS — Temp 100.0°F

## 2020-01-07 DIAGNOSIS — J069 Acute upper respiratory infection, unspecified: Secondary | ICD-10-CM | POA: Insufficient documentation

## 2020-01-07 MED ORDER — GUAIFENESIN 100 MG/5ML PO SOLN: 10.0000 mL | ORAL | 0 refills | Status: DC | PRN

## 2020-01-07 NOTE — Progress Notes (Signed)
Subjective:    Patient ID: Tammy Dorsey, female    DOB: 1980/02/03, 39 y.o.   MRN: [id number]  HPI: Tammy Dorsey is a 39 y.o. female presenting for upper respiratory tract infection.  Chief Complaint  Patient presents with  . Illness    Not feeling well at since Monday of this wk. Having productive cough along with fever and chills. Lots of head pressure   UPPER RESPIRATORY TRACT INFECTION Onset: 01/02/2020; started with a tickle in her throat; moved to chest congestion then head congestion and pressure in sinuses; fever has been low grade Worst symptom: chest congestion,  Fever: yes; low grade 99 Cough: yes; productive Shortness of breath: no Wheezing: no Chest pain: no Chest tightness: yes Chest congestion: yes Nasal congestion: yes Runny nose: no Post nasal drip: yes Sneezing: no Sore throat: no Swollen glands: yes Sinus pressure: yes Headache: yes Face pain: yes Toothache: no Ear pain: no  Ear pressure: no  Eyes red/itching:no Eye drainage/crusting: no  Nausea: no Vomiting: no Diarrhea: no Change in appetite: no Loss of taste/smell: no Rash: no Fatigue: yes Sick contacts: yes; Dad and brother are both sick Strep contacts: no  Context: energy level getting better; rest stable Recurrent sinusitis: no Treatments attempted:  Dayquil, cold/flu Relief with OTC medications: dayquil, cold/flu  No Known Allergies  Outpatient Encounter Medications as of 01/07/2020  Medication Sig  . albuterol (VENTOLIN HFA) 108 (90 Base) MCG/ACT inhaler Ventolin HFA 90 mcg/actuation aerosol inhaler  . Multiple Vitamin (MULTI-VITAMIN) tablet Take 1 tablet by mouth daily.  . [DISCONTINUED] cyclobenzaprine (FLEXERIL) 10 MG tablet Take 1 tablet (10 mg total) by mouth 3 (three) times daily as needed for muscle spasms.  . [DISCONTINUED] diclofenac (VOLTAREN) 75 MG EC tablet Take 1 tablet (75 mg total) by mouth 2 (two) times daily.   No facility-administered encounter  medications on file as of 01/07/2020.    Patient Active Problem List   Diagnosis Date Noted  . Upper respiratory infection, acute 01/07/2020  . Near syncope 07/14/2014    No past medical history on file.  Relevant past medical, surgical, family and social history reviewed and updated as indicated. Interim medical history since our last visit reviewed.  Review of Systems  Constitutional: Positive for appetite change and fatigue. Negative for activity change, chills, diaphoresis and fever.  HENT: Positive for congestion, ear pain, postnasal drip, sinus pressure and sinus pain. Negative for ear discharge, hearing loss, mouth sores, rhinorrhea, sneezing and sore throat.   Eyes: Negative.  Negative for pain, discharge, redness and itching.  Respiratory: Positive for cough and chest tightness. Negative for shortness of breath and wheezing.   Cardiovascular: Negative.  Negative for chest pain.  Gastrointestinal: Negative.  Negative for constipation, diarrhea, nausea and vomiting.  Skin: Negative.  Negative for rash.  Neurological: Positive for headaches. Negative for dizziness and weakness.  Psychiatric/Behavioral: Negative.     Per HPI unless specifically indicated above     Objective:    Temp 100 F (37.8 C)   Wt Readings from Last 3 Encounters:  10/08/17 177 lb (80.3 kg)  08/05/17 173 lb (78.5 kg)  06/24/17 173 lb (78.5 kg)    Physical Exam Unable to perform physical examination due to telephone visit.  Patient talking in complete sentences.    Assessment & Plan:   Problem List Items Addressed This Visit      Respiratory   Upper respiratory infection, acute - Primary    Acute, ongoing x 5 days.  Symptoms likely viral in etiology.  Encouraged COVID testing.  Reassured patient that symptoms and exam findings are most consistent with a viral upper respiratory infection and explained lack of efficacy of antibiotics against viruses.  Discussed expected course and features  suggestive of secondary bacterial infection.  Continue supportive care. Increase fluid intake with water or electrolyte solution like pedialyte. Encouraged acetaminophen as needed for fever/pain. Encouraged salt water gargling, chloraseptic spray and throat lozenges. Encouraged OTC guaifenesin. Encouraged saline sinus flushes and/or neti with humidified air. If symptoms persist to early next week, will consider antibiotics.  With any sudden onset of chest pain or shortness of breath, go to ER.           Follow up plan: Return if symptoms worsen or fail to improve.  This visit was completed via telephone due to the restrictions of the COVID-19 pandemic. All issues as above were discussed and addressed but no physical exam was performed. If it was felt that the patient should be evaluated in the office, they were directed there. The patient verbally consented to this visit. Patient was unable to complete an audio/visual visit due to Lack of equipment. . Location of the patient: home . Location of the provider: work . Those involved with this call:  . Provider: Carnella Guadalajara, DNP . CMA: Annabelle Harman, CMA . Front Desk/Registration: Jeralene Peters  . Time spent on call: 10 minutes on the phone discussing health concerns. 20 minutes total spent in review of patient's record and preparation of their chart.  I verified patient identity using two factors (patient name and date of birth). Patient consents verbally to being seen via telemedicine visit today.

## 2020-01-07 NOTE — Assessment & Plan Note (Signed)
Acute, ongoing x 5 days.  Symptoms likely viral in etiology.  Encouraged COVID testing.  Reassured patient that symptoms and exam findings are most consistent with a viral upper respiratory infection and explained lack of efficacy of antibiotics against viruses.  Discussed expected course and features suggestive of secondary bacterial infection.  Continue supportive care. Increase fluid intake with water or electrolyte solution like pedialyte. Encouraged acetaminophen as needed for fever/pain. Encouraged salt water gargling, chloraseptic spray and throat lozenges. Encouraged OTC guaifenesin. Encouraged saline sinus flushes and/or neti with humidified air. If symptoms persist to early next week, will consider antibiotics.  With any sudden onset of chest pain or shortness of breath, go to ER.

## 2020-01-07 NOTE — Patient Instructions (Signed)

## 2020-01-29 ENCOUNTER — Ambulatory Visit: Payer: Managed Care, Other (non HMO) | Admitting: Nurse Practitioner

## 2020-01-29 ENCOUNTER — Encounter: Payer: Self-pay | Admitting: Nurse Practitioner

## 2020-01-29 VITALS — BP 103/72 | HR 83 | Temp 98.3°F | Ht 67.0 in | Wt 174.0 lb

## 2020-01-29 DIAGNOSIS — J40 Bronchitis, not specified as acute or chronic: Secondary | ICD-10-CM | POA: Diagnosis not present

## 2020-01-29 DIAGNOSIS — J45909 Unspecified asthma, uncomplicated: Secondary | ICD-10-CM | POA: Insufficient documentation

## 2020-01-29 DIAGNOSIS — J4521 Mild intermittent asthma with (acute) exacerbation: Secondary | ICD-10-CM

## 2020-01-29 DIAGNOSIS — U071 COVID-19: Secondary | ICD-10-CM

## 2020-01-29 HISTORY — DX: COVID-19: U07.1

## 2020-01-29 HISTORY — DX: Bronchitis, not specified as acute or chronic: J40

## 2020-01-29 MED ORDER — AZITHROMYCIN 250 MG PO TABS: ORAL_TABLET | ORAL | 0 refills | Status: DC

## 2020-01-29 MED ORDER — ALBUTEROL SULFATE HFA 108 (90 BASE) MCG/ACT IN AERS: 2.0000 | INHALATION_SPRAY | Freq: Four times a day (QID) | RESPIRATORY_TRACT | 1 refills | Status: DC | PRN

## 2020-01-29 MED ORDER — PREDNISONE 10 MG PO TABS: 10.0000 mg | ORAL_TABLET | Freq: Every day | ORAL | 0 refills | Status: DC

## 2020-01-29 NOTE — Assessment & Plan Note (Signed)
Acute, ongoing x weeks.  Will treat with steroids to help decrease inflammation in airway.  Refill in inhaler sent into pharmacy as well.  Lungs clear to auscultation today, however worried about productive sputum development recently.  Will send in antibiotic therapy if symptoms do not improve greatly over weekend.  Continue supportive care with OTC and mucinex.  If symptoms persist, return to clinic.

## 2020-01-29 NOTE — Progress Notes (Signed)
Subjective:    Patient ID: Tammy Dorsey, female    DOB: 1980/09/30, 40 y.o.   MRN: [id number]  HPI: Tammy Dorsey is a 40 y.o. female presenting for cough.  Chief Complaint  Patient presents with  . Wheezing    Hard to breathe and mucus.   UPPER RESPIRATORY TRACT INFECTION Was diagnosed with COVID mid December.  Cough lingered afterwards, this week has progressed with sputum. Onset: weeks Worst symptom: conugh Fever: no Cough: yes  With productive brown sputum,  Shortness of breath: no Wheezing: yes Chest pain: yes, with cough Chest tightness: yes Chest congestion: yes Nasal congestion: no Runny nose: no Post nasal drip: no Sneezing: no Sore throat: no Swollen glands: no Sinus pressure: no Headache: no Face pain: no Toothache: no Ear pain: no  Ear pressure: no  Eyes red/itching:no Eye drainage/crusting: no  Nausea: no  Vomiting: no Diarrhea: no  Change in appetite: no  Loss of taste/smell: no  Rash: no Fatigue: no Sick contacts: no Strep contacts: no  Context: worse Recurrent sinusitis: no Treatments attempted: albuterol inhlaer and nebulizer Relief with OTC medications: no  Allergies  Allergen Reactions  . Aleve-D Sinus & Cold [Pseudoephedrine-Naproxen Na Er]     Outpatient Encounter Medications as of 01/29/2020  Medication Sig  . azithromycin (ZITHROMAX) 250 MG tablet Take 2 tablets (500 mg) day 1 and then 1 tablet (250 mg) days 2-5.  Marland Kitchen predniSONE (DELTASONE) 10 MG tablet Take 1 tablet (10 mg total) by mouth daily with breakfast. Take 40mg  on days 1-2. Take 30mg  on days 3-4. Take 20mg  on days 5-6. Take 10mg  on days 7-8. Take 5mg  on days 9-10, then stop.  Marland Kitchen albuterol (VENTOLIN HFA) 108 (90 Base) MCG/ACT inhaler Inhale 2 puffs into the lungs every 6 (six) hours as needed for wheezing or shortness of breath.  . guaiFENesin (ROBITUSSIN) 100 MG/5ML SOLN Take 10 mLs (200 mg total) by mouth every 4 (four) hours as needed for cough or to loosen  phlegm.  . Multiple Vitamin (MULTI-VITAMIN) tablet Take 1 tablet by mouth daily.  . [DISCONTINUED] albuterol (VENTOLIN HFA) 108 (90 Base) MCG/ACT inhaler Ventolin HFA 90 mcg/actuation aerosol inhaler   No facility-administered encounter medications on file as of 01/29/2020.    Patient Active Problem List   Diagnosis Date Noted  . Bronchitis due to COVID-19 virus 01/29/2020  . Asthma 01/29/2020  . Near syncope 07/14/2014    History reviewed. No pertinent past medical history.  Relevant past medical, surgical, family and social history reviewed and updated as indicated. Interim medical history since our last visit reviewed.  Review of Systems  Constitutional: Negative for activity change, appetite change, fatigue and fever.  HENT: Positive for congestion. Negative for ear pain, postnasal drip, rhinorrhea, sinus pressure and sore throat.   Eyes: Negative.   Respiratory: Positive for cough, chest tightness, shortness of breath and wheezing.   Cardiovascular: Negative.  Negative for chest pain.  Gastrointestinal: Negative.   Musculoskeletal: Negative.   Skin: Negative.  Negative for rash.  Neurological: Negative.   Psychiatric/Behavioral: Negative.     Per HPI unless specifically indicated above     Objective:    BP 103/72   Pulse 83   Temp 98.3 F (36.8 C)   Ht 5\' 7"  (1.702 m)   Wt 174 lb (78.9 kg)   SpO2 98%   BMI 27.25 kg/m   Wt Readings from Last 3 Encounters:  01/29/20 174 lb (78.9 kg)  10/08/17 177 lb (80.3 kg)  08/05/17 173 lb (78.5 kg)    Physical Exam Vitals and nursing note reviewed.  Constitutional:      General: She is not in acute distress.    Appearance: Normal appearance. She is not toxic-appearing.  HENT:     Head: Normocephalic and atraumatic.     Right Ear: External ear normal.     Left Ear: External ear normal.     Nose: Nose normal. No congestion.     Mouth/Throat:     Mouth: Mucous membranes are moist.     Pharynx: Oropharynx is clear.   Eyes:     General: No scleral icterus.    Extraocular Movements: Extraocular movements intact.  Cardiovascular:     Rate and Rhythm: Normal rate.     Heart sounds: Normal heart sounds.  Pulmonary:     Effort: Pulmonary effort is normal. No respiratory distress.     Breath sounds: Normal breath sounds. No wheezing, rhonchi or rales.  Skin:    General: Skin is warm.     Coloration: Skin is not jaundiced or pale.     Findings: No erythema.  Neurological:     General: No focal deficit present.     Mental Status: She is alert and oriented to person, place, and time.     Motor: No weakness.     Gait: Gait normal.  Psychiatric:        Mood and Affect: Mood normal.        Behavior: Behavior normal.        Thought Content: Thought content normal.        Judgment: Judgment normal.        Assessment & Plan:   Problem List Items Addressed This Visit      Respiratory   Bronchitis due to COVID-19 virus - Primary    Acute, ongoing x weeks.  Will treat with steroids to help decrease inflammation in airway.  Refill in inhaler sent into pharmacy as well.  Lungs clear to auscultation today, however worried about productive sputum development recently.  Will send in antibiotic therapy if symptoms do not improve greatly over weekend.  Continue supportive care with OTC and mucinex.  If symptoms persist, return to clinic.       Relevant Medications   albuterol (VENTOLIN HFA) 108 (90 Base) MCG/ACT inhaler   predniSONE (DELTASONE) 10 MG tablet   azithromycin (ZITHROMAX) 250 MG tablet   Asthma   Relevant Medications   albuterol (VENTOLIN HFA) 108 (90 Base) MCG/ACT inhaler   predniSONE (DELTASONE) 10 MG tablet       Follow up plan: Return if symptoms worsen or fail to improve.

## 2020-01-29 NOTE — Patient Instructions (Signed)

## 2020-02-05 MED ORDER — ALBUTEROL SULFATE (2.5 MG/3ML) 0.083% IN NEBU: 2.5000 mg | INHALATION_SOLUTION | Freq: Four times a day (QID) | RESPIRATORY_TRACT | 1 refills | Status: AC | PRN

## 2020-02-17 ENCOUNTER — Ambulatory Visit (INDEPENDENT_AMBULATORY_CARE_PROVIDER_SITE_OTHER): Payer: Managed Care, Other (non HMO) | Admitting: Nurse Practitioner

## 2020-02-17 ENCOUNTER — Other Ambulatory Visit: Payer: Self-pay

## 2020-02-17 ENCOUNTER — Encounter: Payer: Self-pay | Admitting: Nurse Practitioner

## 2020-02-17 VITALS — BP 126/78 | HR 85 | Temp 98.6°F | Wt 173.8 lb

## 2020-02-17 DIAGNOSIS — J4521 Mild intermittent asthma with (acute) exacerbation: Secondary | ICD-10-CM | POA: Diagnosis not present

## 2020-02-17 MED ORDER — FLOVENT HFA 44 MCG/ACT IN AERO: 1.0000 | INHALATION_SPRAY | Freq: Two times a day (BID) | RESPIRATORY_TRACT | 12 refills | Status: DC

## 2020-02-17 NOTE — Progress Notes (Signed)
Subjective:    Patient ID: Tammy Dorsey, female    DOB: 08/09/1980, 40 y.o.   MRN: [id number]  HPI: Tammy Dorsey is a 40 y.o. female presenting for  Chief Complaint  Patient presents with  . Wheezing    Feels she is using the rescue inhaler more than 5 times per day.  Is still wheezing and coughing.   COUGH Recently treated with extended prednisone taper and dual antibiotic therapy with azithromycin and levofloxacin.  Felt the prednisone and antibiotic helped but not all of the way.  Is still wheezing and coughing.  Using rescue inhaler more than 5 times per day.  Resting heart rate typically is 50s-60s and has been 80s-90s and feels like its racing.  Wondering if this is related to breathing treatments.    Has been having to sleep with head elevated, this is helping somewhat. Duration: months Circumstances of initial development of cough: URI Cough severity: moderate Cough description: dry, productive in the morning Aggravating factors: activity, random times all throughout the day Alleviating factors: sleeping with head of bed elevated Status: better Treatments attempted: mucinex, breathing treatments Wheezing: yes; waking her up at night Shortness of breath: sometime  Chest pain: no Chest tightness:yes Nasal congestion: yes Runny nose: no Postnasal drip: yes Frequent throat clearing or swallowing: yes Hemoptysis: no; brown/green Fevers: no Night sweats: no Weight loss: no Heartburn: yes; new this week; gets better with tiem Recent foreign travel: no Tuberculosis contacts: no  Allergies  Allergen Reactions  . Aleve-D Sinus & Cold [Pseudoephedrine-Naproxen Na Er]     Outpatient Encounter Medications as of 02/17/2020  Medication Sig  . fluticasone (FLOVENT HFA) 44 MCG/ACT inhaler Inhale 1 puff into the lungs in the morning and at bedtime.  Marland Kitchen albuterol (PROVENTIL) (2.5 MG/3ML) 0.083% nebulizer solution Take 3 mLs (2.5 mg total) by nebulization every 6  (six) hours as needed for wheezing or shortness of breath.  Marland Kitchen albuterol (VENTOLIN HFA) 108 (90 Base) MCG/ACT inhaler Inhale 2 puffs into the lungs every 6 (six) hours as needed for wheezing or shortness of breath.  . Multiple Vitamin (MULTI-VITAMIN) tablet Take 1 tablet by mouth daily.  . [DISCONTINUED] azithromycin (ZITHROMAX) 250 MG tablet Take 2 tablets (500 mg) day 1 and then 1 tablet (250 mg) days 2-5.  . [DISCONTINUED] predniSONE (DELTASONE) 10 MG tablet Take 1 tablet (10 mg total) by mouth daily with breakfast. Take 40mg  on days 1-2. Take 30mg  on days 3-4. Take 20mg  on days 5-6. Take 10mg  on days 7-8. Take 5mg  on days 9-10, then stop.   No facility-administered encounter medications on file as of 02/17/2020.    Patient Active Problem List   Diagnosis Date Noted  . Bronchitis due to COVID-19 virus 01/29/2020  . Asthma 01/29/2020  . Near syncope 07/14/2014    No past medical history on file.  Relevant past medical, surgical, family and social history reviewed and updated as indicated. Interim medical history since our last visit reviewed.  Review of Systems  Constitutional: Negative.  Negative for activity change, appetite change, fatigue and fever.  HENT: Positive for congestion and postnasal drip. Negative for ear pain, rhinorrhea, sinus pressure, sinus pain, sneezing and sore throat.   Eyes: Negative.  Negative for pain, discharge, redness and itching.  Respiratory: Positive for cough, chest tightness, shortness of breath and wheezing.   Cardiovascular: Positive for palpitations. Negative for chest pain.  Skin: Negative.   Neurological: Negative.   Psychiatric/Behavioral: Negative.     Per  HPI unless specifically indicated above     Objective:    BP 126/78   Pulse 85   Temp 98.6 F (37 C) (Oral)   Wt 173 lb 12.8 oz (78.8 kg)   LMP 12/02/2019   SpO2 99%   BMI 27.22 kg/m   Wt Readings from Last 3 Encounters:  02/17/20 173 lb 12.8 oz (78.8 kg)  01/29/20 174 lb (78.9  kg)  10/08/17 177 lb (80.3 kg)    Physical Exam Vitals and nursing note reviewed.  Constitutional:      General: She is not in acute distress.    Appearance: Normal appearance. She is not toxic-appearing.  HENT:     Head: Normocephalic and atraumatic.     Right Ear: External ear normal.     Left Ear: External ear normal.     Nose: No congestion or rhinorrhea.  Eyes:     General: No scleral icterus.    Extraocular Movements: Extraocular movements intact.  Cardiovascular:     Rate and Rhythm: Normal rate.     Heart sounds: Normal heart sounds. No murmur heard.   Pulmonary:     Effort: Pulmonary effort is normal. No respiratory distress.     Breath sounds: Normal breath sounds. No wheezing, rhonchi or rales.  Skin:    General: Skin is warm and dry.     Capillary Refill: Capillary refill takes less than 2 seconds.     Coloration: Skin is not jaundiced or pale.     Findings: No erythema.  Neurological:     Mental Status: She is alert and oriented to person, place, and time.     Motor: No weakness.     Gait: Gait normal.  Psychiatric:        Mood and Affect: Mood normal.        Behavior: Behavior normal.        Thought Content: Thought content normal.        Judgment: Judgment normal.       Assessment & Plan:   Problem List Items Addressed This Visit      Respiratory   Asthma - Primary    Chronic, uncontrolled.  Still with cough and subjective wheezing, none on examination today.  Better with recent antibiotic therapy and extended prednisone taper.  Will add flovent 44 mcg twice daily; rinse and spit after each use.  Will also obtain chest x-ray.  Return to clinic in 2-3 weeks for close monitoring; sooner if needed.  If no benefit, may consider referral to pulmonary.      Relevant Medications   fluticasone (FLOVENT HFA) 44 MCG/ACT inhaler   Other Relevant Orders   DG Chest 2 View       Follow up plan: Return in about 2 weeks (around 03/02/2020) for wheezing/cough  follow up.

## 2020-02-17 NOTE — Assessment & Plan Note (Signed)
Chronic, uncontrolled.  Still with cough and subjective wheezing, none on examination today.  Better with recent antibiotic therapy and extended prednisone taper.  Will add flovent 44 mcg twice daily; rinse and spit after each use.  Will also obtain chest x-ray.  Return to clinic in 2-3 weeks for close monitoring; sooner if needed.  If no benefit, may consider referral to pulmonary.

## 2020-02-17 NOTE — Patient Instructions (Signed)
http://www.aaaai.org/conditions-and-treatments/asthma">  Asthma, Adult  Asthma is a long-term (chronic) condition that causes recurrent episodes in which the airways become tight and narrow. The airways are the passages that lead from the nose and mouth down into the lungs. Asthma episodes, also called asthma attacks, can cause coughing, wheezing, shortness of breath, and chest pain. The airways can also fill with mucus. During an attack, it can be difficult to breathe. Asthma attacks can range from minor to life threatening. Asthma cannot be cured, but medicines and lifestyle changes can help control it and treat acute attacks. What are the causes? This condition is believed to be caused by inherited (genetic) and environmental factors, but its exact cause is not known. There are many things that can bring on an asthma attack or make asthma symptoms worse (triggers). Asthma triggers are different for each person. Common triggers include:  Mold.  Dust.  Cigarette smoke.  Cockroaches.  Things that can cause allergy symptoms (allergens), such as animal dander or pollen from trees or grass.  Air pollutants such as household cleaners, wood smoke, smog, or chemical odors.  Cold air, weather changes, and winds (which increase molds and pollen in the air).  Strong emotional expressions such as crying or laughing hard.  Stress.  Certain medicines (such as aspirin) or types of medicines (such as beta-blockers).  Sulfites in foods and drinks. Foods and drinks that may contain sulfites include dried fruit, potato chips, and sparkling grape juice.  Infections or inflammatory conditions such as the flu, a cold, or inflammation of the nasal membranes (rhinitis).  Gastroesophageal reflux disease (GERD).  Exercise or strenuous activity. What are the signs or symptoms? Symptoms of this condition may occur right after asthma is triggered or many hours later. Symptoms include:  Wheezing. This can  sound like whistling when you breathe.  Excessive nighttime or early morning coughing.  Frequent or severe coughing with a common cold.  Chest tightness.  Shortness of breath.  Tiredness (fatigue) with minimal activity. How is this diagnosed? This condition is diagnosed based on:  Your medical history.  A physical exam.  Tests, which may include: ? Lung function studies and pulmonary studies (spirometry). These tests can evaluate the flow of air in your lungs. ? Allergy tests. ? Imaging tests, such as X-rays. How is this treated? There is no cure for this condition, but treatment can help control your symptoms. Treatment for asthma usually involves:  Identifying and avoiding your asthma triggers.  Using medicines to control your symptoms. Generally, two types of medicines are used to treat asthma: ? Controller medicines. These help prevent asthma symptoms from occurring. They are usually taken every day. ? Fast-acting reliever or rescue medicines. These quickly relieve asthma symptoms by widening the narrow and tight airways. They are used as needed and provide short-term relief.  Using supplemental oxygen. This may be needed during a severe episode.  Using other medicines, such as: ? Allergy medicines, such as antihistamines, if your asthma attacks are triggered by allergens. ? Immune medicines (immunomodulators). These are medicines that help control the immune system.  Creating an asthma action plan. An asthma action plan is a written plan for managing and treating your asthma attacks. This plan includes: ? A list of your asthma triggers and how to avoid them. ? Information about when medicines should be taken and when their dosage should be changed. ? Instructions about using a device called a peak flow meter. A peak flow meter measures how well the lungs are working   and the severity of your asthma. It helps you monitor your condition. Follow these instructions at  home: Controlling your home environment Control your home environment in the following ways to help avoid triggers and prevent asthma attacks:  Change your heating and air conditioning filter regularly.  Limit your use of fireplaces and wood stoves.  Get rid of pests (such as roaches and mice) and their droppings.  Throw away plants if you see mold on them.  Clean floors and dust surfaces regularly. Use unscented cleaning products.  Try to have someone else vacuum for you regularly. Stay out of rooms while they are being vacuumed and for a short while afterward. If you vacuum, use a dust mask from a hardware store, a double-layered or microfilter vacuum cleaner bag, or a vacuum cleaner with a HEPA filter.  Replace carpet with wood, tile, or vinyl flooring. Carpet can trap dander and dust.  Use allergy-proof pillows, mattress covers, and box spring covers.  Keep your bedroom a trigger-free room.  Avoid pets and keep windows closed when allergens are in the air.  Wash beddings every week in hot water and dry them in a dryer.  Use blankets that are made of polyester or cotton.  Clean bathrooms and kitchens with bleach. If possible, have someone repaint the walls in these rooms with mold-resistant paint. Stay out of the rooms that are being cleaned and painted.  Wash your hands often with soap and water. If soap and water are not available, use hand sanitizer.  Do not allow anyone to smoke in your home. General instructions  Take over-the-counter and prescription medicines only as told by your health care provider. ? Speak with your health care provider if you have questions about how or when to take the medicines. ? Make note if you are requiring more frequent dosages.  Do not use any products that contain nicotine or tobacco, such as cigarettes and e-cigarettes. If you need help quitting, ask your health care provider. Also, avoid being exposed to secondhand smoke.  Use a peak  flow meter as told by your health care provider. Record and keep track of the readings.  Understand and use the asthma action plan to help minimize, or stop an asthma attack, without needing to seek medical care.  Make sure you stay up to date on your yearly vaccinations as told by your health care provider. This may include vaccines for the flu and pneumonia.  Avoid outdoor activities when allergen counts are high and when air quality is low.  Wear a ski mask that covers your nose and mouth during outdoor winter activities. Exercise indoors on cold days if you can.  Warm up before exercising, and take time for a cool-down period after exercise.  Keep all follow-up visits as told by your health care provider. This is important. Where to find more information  For information about asthma, turn to the Centers for Disease Control and Prevention at http://www.clark.net/  For air quality information, turn to [employer] at https://www.miller-reyes.info/ Contact a health care provider if:  You have wheezing, shortness of breath, or a cough even while you are taking medicine to prevent attacks.  The mucus you cough up (sputum) is thicker than usual.  Your sputum changes from clear or white to yellow, green, gray, or bloody.  Your medicines are causing side effects, such as a rash, itching, swelling, or trouble breathing.  You need to use a reliever medicine more than 2-3 times a week.  Your peak  flow reading is still at 50-79% of your personal best after following your action plan for 1 hour.  You have a fever. Get help right away if:  You are getting worse and do not respond to treatment during an asthma attack.  You are short of breath when at rest or when doing very little physical activity.  You have difficulty eating, drinking, or talking.  You have chest pain or tightness.  You develop a fast heartbeat or palpitations.  You have a bluish color to your lips or fingernails.  You are  light-headed or dizzy, or you faint.  Your peak flow reading is less than 50% of your personal best.  You feel too tired to breathe normally. Summary  Asthma is a long-term (chronic) condition that causes recurrent episodes in which the airways become tight and narrow. These episodes can cause coughing, wheezing, shortness of breath, and chest pain.  Asthma cannot be cured, but medicines and lifestyle changes can help control it and treat acute attacks.  Make sure you understand how to avoid triggers and how and when to use your medicines.  Asthma attacks can range from minor to life threatening. Get help right away if you have an asthma attack and do not respond to treatment with your usual rescue medicines. This information is not intended to replace advice given to you by your health care provider. Make sure you discuss any questions you have with your health care provider. Document Revised: 10/09/2019 Document Reviewed: 05/13/2019 Elsevier Patient Education  2021 [employer].

## 2020-02-18 ENCOUNTER — Ambulatory Visit
Admission: RE | Admit: 2020-02-18 | Discharge: 2020-02-18 | Disposition: A | Discharge status: Home / Self Care | Payer: Managed Care, Other (non HMO) | Source: Ambulatory Visit | Attending: Nurse Practitioner | Admitting: Nurse Practitioner

## 2020-02-18 ENCOUNTER — Encounter: Payer: Self-pay | Admitting: Radiology

## 2020-02-18 DIAGNOSIS — J4521 Mild intermittent asthma with (acute) exacerbation: Secondary | ICD-10-CM

## 2020-03-02 NOTE — Progress Notes (Signed)
Subjective:    Patient ID: Tammy Dorsey, female    DOB: 18-Mar-1980, 40 y.o.   MRN: [id number]  HPI: Tammy Dorsey is a 40 y.o. female presenting for asthma follow up.  Chief Complaint  Patient presents with  . Follow-up    Feels like there has been improvement with breathing but not where she needs to be. Having asthma attacks in the night. Last one 3am this morning   ASTHMA History of asthma - reports she was diagnosed as an adult prior to having children; does not have records but remembers doing the "breathing tests."   Reports asthma was very well-controlled until diagnosed with COVID mid-December 2021.  Since then, she has had a cough and frequent (daily) asthma attacks, worst at night time.  Since mid-December, was treated with azithromycin, Augmentin, and an extended prednisone taper in early January.  At our last visit late January, we started low-dose Flovent.    Today, she reports that at nighttime, her symptoms are, "brutal."  Every night, she wakes up between 1 and 3 am with an asthma attack.  She shared a clip of her breathing with me and I could audibly hear wheezing.  The albuterol inhaler/nebulizer has been helping, but she is still taking every day.  She sleeps upright with a diffuser.   Asthma status: uncontrolled Satisfied with current treatment?: no Albuterol/rescue inhaler frequency: daily Dyspnea frequency: daily Wheezing frequency: daily - worst at night time or with activity Pleurisy: yes Cough frequency: daily Nocturnal symptom frequency: daily Limitation of activity: yes Current upper respiratory symptoms: yes; cough, congestion, Triggers: activity, night time  Last Spirometry: does not remember  Failed/intolerant to following asthma meds: no  Asthma meds in past: SABA, low dose ICS Aerochamber/spacer use: no Visits to ER or Urgent Care in past year: no Pneumovax: Not up to Date Influenza: Not up to Date  Allergies  Allergen Reactions  .  Aleve-D Sinus & Cold [Pseudoephedrine-Naproxen Na Er]     Outpatient Encounter Medications as of 03/03/2020  Medication Sig  . albuterol (PROVENTIL) (2.5 MG/3ML) 0.083% nebulizer solution Take 3 mLs (2.5 mg total) by nebulization every 6 (six) hours as needed for wheezing or shortness of breath.  Marland Kitchen albuterol (VENTOLIN HFA) 108 (90 Base) MCG/ACT inhaler Inhale 2 puffs into the lungs every 6 (six) hours as needed for wheezing or shortness of breath.  . fluticasone (FLONASE) 50 MCG/ACT nasal spray Place 2 sprays into both nostrils daily.  . fluticasone (FLOVENT HFA) 220 MCG/ACT inhaler Inhale 1 puff into the lungs every 12 (twelve) hours. Rinse mouth out with water after each use.  . Multiple Vitamin (MULTI-VITAMIN) tablet Take 1 tablet by mouth daily.  . [DISCONTINUED] fluticasone (FLOVENT HFA) 44 MCG/ACT inhaler Inhale 1 puff into the lungs in the morning and at bedtime.   No facility-administered encounter medications on file as of 03/03/2020.    Patient Active Problem List   Diagnosis Date Noted  . Non-seasonal allergic rhinitis 03/03/2020  . History of COVID-19 03/03/2020  . Asthma 01/29/2020    Past Medical History:  Diagnosis Date  . Allergic rhinitis   . Asthma   . Bronchitis due to COVID-19 virus 01/29/2020  . Near syncope 07/14/2014    Relevant past medical, surgical, family and social history reviewed and updated as indicated. Interim medical history since our last visit reviewed.  Review of Systems Per HPI unless specifically indicated above     Objective:    BP 118/78   Pulse  72   Temp 98.2 F (36.8 C)   Ht 5\' 7"  (1.702 m)   Wt 172 lb 9.6 oz (78.3 kg)   SpO2 96%   BMI 27.03 kg/m   Wt Readings from Last 3 Encounters:  03/03/20 172 lb 9.6 oz (78.3 kg)  02/17/20 173 lb 12.8 oz (78.8 kg)  01/29/20 174 lb (78.9 kg)    Physical Exam Vitals and nursing note reviewed.  Constitutional:      General: She is not in acute distress.    Appearance: Normal appearance.  She is not toxic-appearing.  HENT:     Head: Normocephalic.     Right Ear: External ear normal.     Left Ear: External ear normal.     Nose: Nose normal. No congestion.     Mouth/Throat:     Mouth: Mucous membranes are moist.     Pharynx: Oropharynx is clear. Posterior oropharyngeal erythema present.  Eyes:     General: No scleral icterus.    Extraocular Movements: Extraocular movements intact.  Cardiovascular:     Rate and Rhythm: Normal rate and regular rhythm.     Heart sounds: Normal heart sounds. No murmur heard.   Pulmonary:     Effort: Pulmonary effort is normal. No respiratory distress.     Breath sounds: No wheezing, rhonchi or rales.  Musculoskeletal:     Cervical back: Normal range of motion.  Lymphadenopathy:     Cervical: No cervical adenopathy.  Skin:    General: Skin is warm.     Capillary Refill: Capillary refill takes less than 2 seconds.     Coloration: Skin is not jaundiced or pale.     Findings: No erythema.  Neurological:     Mental Status: She is alert.     Motor: No weakness.     Gait: Gait normal.  Psychiatric:        Mood and Affect: Mood normal.        Behavior: Behavior normal.        Thought Content: Thought content normal.        Judgment: Judgment normal.       Assessment & Plan:   Problem List Items Addressed This Visit      Respiratory   Asthma - Primary    Chronic, uncontrolled.  With some benefit with flovent, will increase to high dose twice daily and place referral to pulmonology for ongoing management.  No s/s pneumonia today - no fevers, lungs CTAB.  Follow up with [place] if she has not heard from pulmonary referral in ~2 weeks.      Relevant Medications   fluticasone (FLOVENT HFA) 220 MCG/ACT inhaler   Other Relevant Orders   Ambulatory referral to Pulmonology   Non-seasonal allergic rhinitis    Chronic, exacerbated currently.  Already taking daily cetirizine; will add daily flonase and monitor for symptom improvement.         Relevant Medications   fluticasone (FLONASE) 50 MCG/ACT nasal spray     Other   History of COVID-19       Follow up plan: Return if symptoms worsen or fail to improve.

## 2020-03-03 ENCOUNTER — Encounter (HOSPITAL_COMMUNITY): Payer: Self-pay | Admitting: Emergency Medicine

## 2020-03-03 ENCOUNTER — Telehealth: Payer: Self-pay

## 2020-03-03 ENCOUNTER — Emergency Department (HOSPITAL_COMMUNITY)
Admission: EM | Admit: 2020-03-03 | Discharge: 2020-03-03 | Disposition: A | Discharge status: Home / Self Care | Payer: Managed Care, Other (non HMO) | Attending: Emergency Medicine | Admitting: Emergency Medicine

## 2020-03-03 ENCOUNTER — Emergency Department (HOSPITAL_COMMUNITY): Payer: Managed Care, Other (non HMO)

## 2020-03-03 ENCOUNTER — Ambulatory Visit: Payer: Managed Care, Other (non HMO) | Admitting: Nurse Practitioner

## 2020-03-03 ENCOUNTER — Other Ambulatory Visit: Payer: Self-pay

## 2020-03-03 ENCOUNTER — Encounter: Payer: Self-pay | Admitting: Nurse Practitioner

## 2020-03-03 VITALS — BP 118/78 | HR 72 | Temp 98.2°F | Ht 67.0 in | Wt 172.6 lb

## 2020-03-03 DIAGNOSIS — R0789 Other chest pain: Secondary | ICD-10-CM

## 2020-03-03 DIAGNOSIS — Z8616 Personal history of COVID-19: Secondary | ICD-10-CM | POA: Insufficient documentation

## 2020-03-03 DIAGNOSIS — J4550 Severe persistent asthma, uncomplicated: Secondary | ICD-10-CM | POA: Diagnosis not present

## 2020-03-03 DIAGNOSIS — R071 Chest pain on breathing: Secondary | ICD-10-CM | POA: Insufficient documentation

## 2020-03-03 DIAGNOSIS — R0602 Shortness of breath: Secondary | ICD-10-CM

## 2020-03-03 DIAGNOSIS — R0781 Pleurodynia: Secondary | ICD-10-CM

## 2020-03-03 DIAGNOSIS — Z79899 Other long term (current) drug therapy: Secondary | ICD-10-CM | POA: Diagnosis not present

## 2020-03-03 DIAGNOSIS — J45909 Unspecified asthma, uncomplicated: Secondary | ICD-10-CM | POA: Diagnosis not present

## 2020-03-03 DIAGNOSIS — J3089 Other allergic rhinitis: Secondary | ICD-10-CM | POA: Diagnosis not present

## 2020-03-03 DIAGNOSIS — R059 Cough, unspecified: Secondary | ICD-10-CM | POA: Diagnosis not present

## 2020-03-03 LAB — TROPONIN I (HIGH SENSITIVITY)
Troponin I (High Sensitivity): 5 ng/L (ref <18)
Troponin I (High Sensitivity): 4 ng/L (ref <18)

## 2020-03-03 LAB — CBC
HCT: 38.9 % (ref 36.0–46.0)
Hemoglobin: 13.1 g/dL (ref 12.0–15.0)
MCH: 31 pg (ref 26.0–34.0)
MCHC: 33.7 g/dL (ref 30.0–36.0)
MCV: 92 fL (ref 80.0–100.0)
Platelets: 292 10*3/uL (ref 150–400)
RBC: 4.23 MIL/uL (ref 3.87–5.11)
RDW: 12.7 % (ref 11.5–15.5)
WBC: 11 10*3/uL — ABNORMAL HIGH (ref 4.0–10.5)
nRBC: 0 % (ref 0.0–0.2)

## 2020-03-03 LAB — BASIC METABOLIC PANEL
Anion gap: 9 (ref 5–15)
BUN: 15 mg/dL (ref 6–20)
CO2: 25 mmol/L (ref 22–32)
Calcium: 9.2 mg/dL (ref 8.9–10.3)
Chloride: 104 mmol/L (ref 98–111)
Creatinine, Ser: 0.98 mg/dL (ref 0.44–1.00)
GFR, Estimated: >60 mL/min (ref >60)
Glucose, Bld: 89 mg/dL (ref 70–99)
Potassium: 4.2 mmol/L (ref 3.5–5.1)
Sodium: 138 mmol/L (ref 135–145)

## 2020-03-03 MED ORDER — MORPHINE SULFATE (PF) 4 MG/ML IV SOLN
4.0000 mg | Freq: Once | INTRAVENOUS | Status: AC
  Administered 2020-03-03: 4 mg via INTRAVENOUS
  Filled 2020-03-03: qty 1

## 2020-03-03 MED ORDER — HYDROMORPHONE HCL 1 MG/ML IJ SOLN
1.0000 mg | Freq: Once | INTRAMUSCULAR | Status: AC
  Administered 2020-03-03: 1 mg via INTRAVENOUS
  Filled 2020-03-03: qty 1

## 2020-03-03 MED ORDER — FLUTICASONE PROPIONATE 50 MCG/ACT NA SUSP: 2.0000 | Freq: Every day | NASAL | 6 refills | Status: AC

## 2020-03-03 MED ORDER — LIDOCAINE 5 % EX PTCH: 1.0000 | MEDICATED_PATCH | CUTANEOUS | 0 refills | Status: DC

## 2020-03-03 MED ORDER — IOHEXOL 350 MG/ML SOLN
75.0000 mL | Freq: Once | INTRAVENOUS | Status: AC | PRN
  Administered 2020-03-03: 75 mL via INTRAVENOUS

## 2020-03-03 MED ORDER — CYCLOBENZAPRINE HCL 5 MG PO TABS: 5.0000 mg | ORAL_TABLET | Freq: Two times a day (BID) | ORAL | 0 refills | Status: AC | PRN

## 2020-03-03 MED ORDER — FLOVENT HFA 220 MCG/ACT IN AERO: 1.0000 | INHALATION_SPRAY | Freq: Two times a day (BID) | RESPIRATORY_TRACT | 3 refills | Status: AC

## 2020-03-03 NOTE — Patient Instructions (Signed)
F/u for physical when ready Someone will call for pulmonology appointment.

## 2020-03-03 NOTE — Telephone Encounter (Signed)
Pt seen in office today for follow up visit astma. Pt states when she got home began coughing and heard/felt popping on left side of abdomen. In extreme pain, NP notified. Told to go to ER for xray. Pt understood orders

## 2020-03-03 NOTE — Assessment & Plan Note (Addendum)
Chronic, uncontrolled.  With some benefit with flovent, will increase to high dose twice daily and place referral to pulmonology for ongoing management.  No s/s pneumonia today - no fevers, lungs CTAB.  Follow up with [place] if she has not heard from pulmonary referral in ~2 weeks.

## 2020-03-03 NOTE — Assessment & Plan Note (Signed)
Chronic, exacerbated currently.  Already taking daily cetirizine; will add daily flonase and monitor for symptom improvement.

## 2020-03-03 NOTE — ED Notes (Signed)
Patient transported to CT 

## 2020-03-03 NOTE — ED Triage Notes (Signed)
Patient here for evaluation of a "pop" in left chest followed by pain that started around 0930 this morning while coughing.

## 2020-03-03 NOTE — Discharge Instructions (Addendum)
Your work-up today was overall reassuring and we did not find any evidence of pneumonia, collapsed lung, blood clot, or other acute cause of your discomfort.  I suspect musculoskeletal chest wall discomfort causing the majority of your symptoms.  Please use the muscle relaxant and the patches to help with the pain and please follow-up with your primary doctor.  Please rest and keep taking deep breaths.  If any symptoms change or worsen, please return to the nearest emergency department.

## 2020-03-03 NOTE — ED Provider Notes (Signed)
[facility] EMERGENCY DEPARTMENT Provider Note   CSN: [id number] Arrival date & time: 03/03/20  1212     History Chief Complaint  Patient presents with  . Shortness of Breath    Tammy Dorsey is a 40 y.o. female.  The history is provided by the patient and medical records. No language interpreter was used.  Shortness of Breath Severity:  Severe Onset quality:  Sudden Duration:  10 hours Timing:  Constant Progression:  Unchanged Chronicity:  New Context: URI (cough)   Relieved by:  Nothing Worsened by:  Coughing and deep breathing Ineffective treatments:  NSAIDs Associated symptoms: chest pain, cough and sputum production   Associated symptoms: no abdominal pain, no diaphoresis, no fever, no headaches, no hemoptysis, no neck pain, no rash, no vomiting and no wheezing   Risk factors: no hx of PE/DVT        Past Medical History:  Diagnosis Date  . Allergic rhinitis   . Asthma   . Bronchitis due to COVID-19 virus 01/29/2020  . Near syncope 07/14/2014    Patient Active Problem List   Diagnosis Date Noted  . Non-seasonal allergic rhinitis 03/03/2020  . History of COVID-19 03/03/2020  . Asthma 01/29/2020    Past Surgical History:  Procedure Laterality Date  . CESAREAN SECTION    . WISDOM TOOTH EXTRACTION       OB History   No obstetric history on file.     Family History  Problem Relation Age of Onset  . Heart disease Maternal Uncle   . Early death Neg Hx     Social History   Tobacco Use  . Smoking status: Never Smoker  . Smokeless tobacco: Never Used  Vaping Use  . Vaping Use: Never used  Substance Use Topics  . Alcohol use: Yes    Alcohol/week: 2.0 standard drinks    Types: 2 Glasses of wine per week  . Drug use: No    Home Medications Prior to Admission medications   Medication Sig Start Date End Date Taking? Authorizing Provider  albuterol (PROVENTIL) (2.5 MG/3ML) 0.083% nebulizer solution Take 3 mLs (2.5 mg total) by  nebulization every 6 (six) hours as needed for wheezing or shortness of breath. 02/05/20   Eulogio Bear, NP  albuterol (VENTOLIN HFA) 108 (90 Base) MCG/ACT inhaler Inhale 2 puffs into the lungs every 6 (six) hours as needed for wheezing or shortness of breath. 01/29/20   Eulogio Bear, NP  fluticasone (FLONASE) 50 MCG/ACT nasal spray Place 2 sprays into both nostrils daily. 03/03/20   Eulogio Bear, NP  fluticasone (FLOVENT HFA) 220 MCG/ACT inhaler Inhale 1 puff into the lungs every 12 (twelve) hours. Rinse mouth out with water after each use. 03/03/20   Eulogio Bear, NP  Multiple Vitamin (MULTI-VITAMIN) tablet Take 1 tablet by mouth daily.    [provider]    Allergies    Aleve-d sinus & cold [pseudoephedrine-naproxen na er]  Review of Systems   Review of Systems  Constitutional: Negative for chills, diaphoresis, fatigue and fever.  Respiratory: Positive for cough, sputum production, chest tightness and shortness of breath. Negative for hemoptysis, wheezing and stridor.   Cardiovascular: Positive for chest pain. Negative for palpitations and leg swelling.  Gastrointestinal: Negative for abdominal pain, constipation, diarrhea, nausea and vomiting.  Genitourinary: Negative for dysuria.  Musculoskeletal: Positive for back pain. Negative for neck pain and neck stiffness.  Skin: Negative for rash and wound.  Neurological: Negative for light-headedness and headaches.  Psychiatric/Behavioral: Negative for agitation.  All other systems reviewed and are negative.   Physical Exam Updated Vital Signs BP 109/82   Pulse 79   Temp 97.6 F (36.4 C)   Resp 18   LMP 02/29/2020 (Exact Date)   SpO2 99%   Physical Exam Vitals and nursing note reviewed.  Constitutional:      General: She is not in acute distress.    Appearance: She is well-developed and well-nourished. She is not ill-appearing, toxic-appearing or diaphoretic.  HENT:     Head: Normocephalic and  atraumatic.  Eyes:     Conjunctiva/sclera: Conjunctivae normal.     Pupils: Pupils are equal, round, and reactive to light.  Cardiovascular:     Rate and Rhythm: Normal rate and regular rhythm.     Heart sounds: No murmur heard.   Pulmonary:     Effort: Pulmonary effort is normal. No respiratory distress.     Breath sounds: Normal breath sounds. No decreased breath sounds, wheezing, rhonchi or rales.  Chest:     Chest wall: Tenderness present.  Abdominal:     Palpations: Abdomen is soft.     Tenderness: There is no abdominal tenderness.  Musculoskeletal:        General: No edema.     Cervical back: Neck supple.     Right lower leg: No tenderness. No edema.     Left lower leg: No tenderness. No edema.  Skin:    General: Skin is warm and dry.     Capillary Refill: Capillary refill takes less than 2 seconds.     Findings: No erythema.  Neurological:     Mental Status: She is alert.  Psychiatric:        Mood and Affect: Mood is anxious.     ED Results / Procedures / Treatments   Labs (all labs ordered are listed, but only abnormal results are displayed) Labs Reviewed  CBC - Abnormal; Notable for the following components:      Result Value   WBC 11.0 (*)    All other components within normal limits  BASIC METABOLIC PANEL  TROPONIN I (HIGH SENSITIVITY)  TROPONIN I (HIGH SENSITIVITY)    EKG EKG Interpretation  Date/Time:  Thursday March 03 2020 12:22:33 EST Ventricular Rate:  69 PR Interval:  144 QRS Duration: 84 QT Interval:  386 QTC Calculation: 413 R Axis:   72 Text Interpretation: Normal sinus rhythm Nonspecific ST abnormality Baseline wander Confirmed by Lajean Saver [phone]) on 03/03/2020 5:15:52 PM   Radiology DG Chest 2 View  Result Date: 03/03/2020 CLINICAL DATA:  Shortness of breath and cough EXAM: CHEST - 2 VIEW COMPARISON:  February 18, 2020 FINDINGS: The lungs are clear. The heart size and pulmonary vascularity are normal. No adenopathy. No bone  lesions. IMPRESSION: Lungs clear.  Cardiac silhouette normal. Electronically Signed   By: Lowella Grip III M.D.   On: 03/03/2020 12:59   CT Angio Chest PE W and/or Wo Contrast  Result Date: 03/03/2020 CLINICAL DATA:  Left-sided chest pain EXAM: CT ANGIOGRAPHY CHEST WITH CONTRAST TECHNIQUE: Multidetector CT imaging of the chest was performed using the standard protocol during bolus administration of intravenous contrast. Multiplanar CT image reconstructions and MIPs were obtained to evaluate the vascular anatomy. CONTRAST:  51mL OMNIPAQUE IOHEXOL 350 MG/ML SOLN COMPARISON:  None. FINDINGS: Cardiovascular: There is slightly suboptimal opacification of the main pulmonary artery, however no central or proximal segmental pulmonary embolism. The heart is normal in size. No pericardial effusion or thickening. No  evidence right heart strain. There is normal three-vessel brachiocephalic anatomy without proximal stenosis. The thoracic aorta is normal in appearance. Mediastinum/Nodes: No hilar, mediastinal, or axillary adenopathy. Thyroid gland, trachea, and esophagus demonstrate no significant findings. Lungs/Pleura: The lungs are clear. No pleural effusion or pneumothorax. No airspace consolidation. Upper Abdomen: No acute abnormalities present in the visualized portions of the upper abdomen. Musculoskeletal: No chest wall abnormality. No acute or significant osseous findings. Review of the MIP images confirms the above findings. IMPRESSION: No central or proximal segmental pulmonary embolism No acute intrathoracic pathology to explain the patient's symptoms. Electronically Signed   By: Prudencio Pair M.D.   On: 03/03/2020 21:22    Procedures Procedures    Medications Ordered in ED Medications  morphine 4 MG/ML injection 4 mg (4 mg Intravenous Given 03/03/20 2026)  iohexol (OMNIPAQUE) 350 MG/ML injection 75 mL (75 mLs Intravenous Contrast Given 03/03/20 2104)  HYDROmorphone (DILAUDID) injection 1 mg (1 mg  Intravenous Given 03/03/20 2203)    ED Course  I have reviewed the triage vital signs and the nursing notes.  Pertinent labs & imaging results that were available during my care of the patient were reviewed by me and considered in my medical decision making (see chart for details).    MDM Rules/Calculators/A&P                          Shannah JAMIYA NIMS is a 40 y.o. female with a past medical history significant for asthma, bronchitis, and recent diagnosis of COVID-19 infection in December who presents with severe left-sided pleuritic chest pain and shortness of breath after coughing fit.  Patient reports that she went to her PCP today as she been having worsened episodes of shortness of breath at night and what she thought was asthma attacks.  She says that after coughing fit this morning after the visit, she felt a pop and started having immediate onset of pain in her left chest wrapping around towards her back.  She is never had this type of pain before.  She describes as greater than 10 out of 10 and she cannot take deep breaths.  She feels short of breath.  It is very tender on her torso and she denies any rash.  She denies any other trauma.  She denies any fevers or chills and has a productive phlegm cough with no hemoptysis.  She denies any nausea, vomiting, constipation, diarrhea, or urinary symptoms.  No history of DVT or PE and denies any new leg pain or leg swelling.  No abdominal pain.  Patient waited approximately 7 hours and 45 minutes prior to my initial valuation emergency department.    On exam, patient's left chest is noted to palpation.  Lungs were symmetric and clear.  No significant wheezing on my eval.  Abdomen nontender.  Good pulses in extremities.  Legs nontender nonedematous.  Patient is not tachycardic and has reassuring vitals initially.  She is not hypoxic.  Patient had imaging and labs in triage that were overall reassuring initially.  Delta troponin was negative.  EKG  did not show STEMI.  X-ray did not show pneumothorax or pneumonia.  Clinically I am somewhat concerned that there may be an occult pneumothorax or even a pulmonary embolism given her history of recent Covid.  We had a shared decision-making conversation and agreed to get a CT PE study to help look for all these concerning findings.  Hopefully they will not be seen on  the imaging and we can suspect her symptoms are due to musculoskeletal pain and discuss further pain management strategies.  Anticipate reassessment after work-up.      9:58 PM CT scan returned showing no acute findings including no pulmonary embolism or occult pneumothorax.  Patient ports pain medicine help slightly but will give another dose of pain medicine to help get it more under control.  Patient will be given prescription for muscle relaxant and Lidoderm patches and she agrees with this plan of care with PCP follow-up.  She understands extremities return precautions.  Patient will be discharged shortly.      Final Clinical Impression(s) / ED Diagnoses Final diagnoses:  Left-sided chest wall pain  Cough  Shortness of breath  Pleuritic chest pain    Rx / DC Orders ED Discharge Orders         Ordered    cyclobenzaprine (FLEXERIL) 5 MG tablet  2 times daily PRN        03/03/20 2201    lidocaine (LIDODERM) 5 %  Every 24 hours        03/03/20 2201         Clinical Impression: 1. Left-sided chest wall pain   2. Cough   3. Shortness of breath   4. Pleuritic chest pain     Disposition: Discharge  Condition: Good  I have discussed the results, Dx and Tx plan with the pt(& family if present). He/she/they expressed understanding and agree(s) with the plan. Discharge instructions discussed at great length. Strict return precautions discussed and pt &/or family have verbalized understanding of the instructions. No further questions at time of discharge.    New Prescriptions   CYCLOBENZAPRINE (FLEXERIL) 5 MG  TABLET    Take 1-2 tablets (5-10 mg total) by mouth 2 (two) times daily as needed for muscle spasms.   LIDOCAINE (LIDODERM) 5 %    Place 1 patch onto the skin daily. Remove & Discard patch within 12 hours or as directed by MD    Follow Up: Susy Frizzle, MD [address] [place] [address] [phone]     [place] [address] [phone] mc [place]       Tegeler, Gwenyth Allegra, MD 03/03/20 2234

## 2020-03-04 ENCOUNTER — Telehealth: Payer: Self-pay | Admitting: *Deleted

## 2020-03-04 NOTE — Telephone Encounter (Signed)
Transition Care Management Follow-up Telephone Call  Date of discharge and from where: 03/03/2020 [facility]  How have you been since you were released from the hospital? "I am doing better today but I am still in some pain."  Any questions or concerns? No  Items Reviewed:  Did the pt receive and understand the discharge instructions provided? Yes   Medications obtained and verified? Yes   Other? N/A  Any new allergies since your discharge? No   Dietary orders reviewed? Yes  Do you have support at home? Yes   Home Care and Equipment/Supplies: Were home health services ordered? not applicable If so, what is the name of the agency? N/A  Has the agency set up a time to come to the patient's home? not applicable Were any new equipment or medical supplies ordered?  No What is the name of the medical supply agency? N/A Were you able to get the supplies/equipment? not applicable Do you have any questions related to the use of the equipment or supplies? No  Functional Questionnaire: (I = Independent and D = Dependent) ADLs: I  Bathing/Dressing- I  Meal Prep- I  Eating- I  Maintaining continence- I  Transferring/Ambulation- I  Managing Meds- I  Follow up appointments reviewed:   PCP Hospital f/u appt confirmed? No  - Advised pt that if her pain continues or gets worse, she should call and get an appointment. She verbalized understanding.  [facility] f/u appt confirmed? N/A   Are transportation arrangements needed? N/A  If their condition worsens, is the pt aware to call PCP or go to the Emergency Dept.? Yes  Was the patient provided with contact information for the PCP's office or ED? Yes  Was to pt encouraged to call back with questions or concerns? Yes

## 2020-04-25 ENCOUNTER — Encounter: Payer: Self-pay | Admitting: Pulmonary Disease

## 2020-04-25 ENCOUNTER — Other Ambulatory Visit: Payer: Self-pay

## 2020-04-25 ENCOUNTER — Ambulatory Visit: Payer: Managed Care, Other (non HMO) | Admitting: Pulmonary Disease

## 2020-04-25 VITALS — BP 118/74 | HR 77 | Temp 98.2°F | Ht 67.0 in | Wt 181.2 lb

## 2020-04-25 DIAGNOSIS — U099 Post covid-19 condition, unspecified: Secondary | ICD-10-CM | POA: Diagnosis not present

## 2020-04-25 DIAGNOSIS — J45909 Unspecified asthma, uncomplicated: Secondary | ICD-10-CM | POA: Diagnosis not present

## 2020-04-25 LAB — CBC WITH DIFFERENTIAL/PLATELET
Basophils Absolute: 0 10*3/uL (ref 0.0–0.1)
Basophils Relative: 0.5 % (ref 0.0–3.0)
Eosinophils Absolute: 0.2 10*3/uL (ref 0.0–0.7)
Eosinophils Relative: 2 % (ref 0.0–5.0)
HCT: 39.7 % (ref 36.0–46.0)
Hemoglobin: 13.2 g/dL (ref 12.0–15.0)
Lymphocytes Relative: 24 % (ref 12.0–46.0)
Lymphs Abs: 2.4 10*3/uL (ref 0.7–4.0)
MCHC: 33.3 g/dL (ref 30.0–36.0)
MCV: 90.8 fl (ref 78.0–100.0)
Monocytes Absolute: 0.6 10*3/uL (ref 0.1–1.0)
Monocytes Relative: 5.9 % (ref 3.0–12.0)
Neutro Abs: 6.9 10*3/uL (ref 1.4–7.7)
Neutrophils Relative %: 67.6 % (ref 43.0–77.0)
Platelets: 257 10*3/uL (ref 150.0–400.0)
RBC: 4.37 Mil/uL (ref 3.87–5.11)
RDW: 12.6 % (ref 11.5–15.5)
WBC: 10.2 10*3/uL (ref 4.0–10.5)

## 2020-04-25 MED ORDER — BUDESONIDE-FORMOTEROL FUMARATE 160-4.5 MCG/ACT IN AERO: 2.0000 | INHALATION_SPRAY | Freq: Two times a day (BID) | RESPIRATORY_TRACT | 6 refills | Status: AC

## 2020-04-25 NOTE — Progress Notes (Signed)
Tammy Dorsey    [id number]    Jan 28, 1980  Primary Care Physician:Pickard, Cammie Mcgee, MD  Referring Physician: Eulogio Bear, NP [address]  [place] [address]  Chief complaint: Consult for asthma, post COVID-13  HPI: 40 year old with asthma, seasonal allergies She has mild asthma baseline controlled with rescue medication.  She developed COVID-19 in December 2021, did not require hospitalization This was associated with worsening of asthma controlled with bronchitis, recurrent exacerbations.  So far she has received 2 rounds of prednisone and antibiotic and started on Flovent rescue inhaler  She was evaluated in the ED in February 2022 for cough with pleuritic chest pain.  CTA at that time did not show PE or lung abnormalities  The inhaler has helped with the breathing somewhat but she still gets short of breath while exerting and is requiring albuterol rescue medication frequently  Pets: Dog, cat Occupation: Farmer Exposures: No mold, hot tub, Jacuzzi.  No feather pillows or comforters Smoking history: Never smoker Travel history: No significant travel history Relevant family history: Mom and dad had asthma  Outpatient Encounter Medications as of 04/25/2020  Medication Sig  . albuterol (PROVENTIL) (2.5 MG/3ML) 0.083% nebulizer solution Take 3 mLs (2.5 mg total) by nebulization every 6 (six) hours as needed for wheezing or shortness of breath.  Marland Kitchen albuterol (VENTOLIN HFA) 108 (90 Base) MCG/ACT inhaler Inhale 2 puffs into the lungs every 6 (six) hours as needed for wheezing or shortness of breath.  . cyclobenzaprine (FLEXERIL) 5 MG tablet Take 1-2 tablets (5-10 mg total) by mouth 2 (two) times daily as needed for muscle spasms.  . fluticasone (FLONASE) 50 MCG/ACT nasal spray Place 2 sprays into both nostrils daily.  . fluticasone (FLOVENT HFA) 220 MCG/ACT inhaler Inhale 1 puff into the lungs every 12 (twelve) hours. Rinse mouth out with water after each  use.  . Multiple Vitamin (MULTI-VITAMIN) tablet Take 1 tablet by mouth daily.  Marland Kitchen lidocaine (LIDODERM) 5 % Place 1 patch onto the skin daily. Remove & Discard patch within 12 hours or as directed by MD   No facility-administered encounter medications on file as of 04/25/2020.    Allergies as of 04/25/2020 - Review Complete 04/25/2020  Allergen Reaction Noted  . Aleve-d sinus & cold [pseudoephedrine-naproxen na er]  01/29/2020    Past Medical History:  Diagnosis Date  . Allergic rhinitis   . Asthma   . Bronchitis due to COVID-19 virus 01/29/2020  . Near syncope 07/14/2014    Past Surgical History:  Procedure Laterality Date  . CESAREAN SECTION    . WISDOM TOOTH EXTRACTION      Family History  Problem Relation Age of Onset  . Heart disease Maternal Uncle   . Early death Neg Hx     Social History   Socioeconomic History  . Marital status: Married    Spouse name: Not on file  . Number of children: Not on file  . Years of education: Not on file  . Highest education level: Not on file  Occupational History  . Not on file  Tobacco Use  . Smoking status: Never Smoker  . Smokeless tobacco: Never Used  Vaping Use  . Vaping Use: Never used  Substance and Sexual Activity  . Alcohol use: Yes    Alcohol/week: 2.0 standard drinks    Types: 2 Glasses of wine per week  . Drug use: No  . Sexual activity: Yes    Comment:  Married, 2 kids  Other Topics Concern  . Not on file  Social History Narrative  . Not on file   Social Determinants of Health   Financial Resource Strain: Not on file  Food Insecurity: Not on file  Transportation Needs: Not on file  Physical Activity: Not on file  Stress: Not on file  Social Connections: Not on file  Intimate Partner Violence: Not on file    Review of systems: Review of Systems  Constitutional: Negative for fever and chills.  HENT: Negative.   Eyes: Negative for blurred vision.  Respiratory: as per HPI  Cardiovascular: Negative for  chest pain and palpitations.  Gastrointestinal: Negative for vomiting, diarrhea, blood per rectum. Genitourinary: Negative for dysuria, urgency, frequency and hematuria.  Musculoskeletal: Negative for myalgias, back pain and joint pain.  Skin: Negative for itching and rash.  Neurological: Negative for dizziness, tremors, focal weakness, seizures and loss of consciousness.  Endo/Heme/Allergies: Negative for environmental allergies.  Psychiatric/Behavioral: Negative for depression, suicidal ideas and hallucinations.  All other systems reviewed and are negative.  Physical Exam: Blood pressure 118/74, pulse 77, temperature 98.2 F (36.8 C), temperature source Temporal, height 5\' 7"  (1.702 m), weight 181 lb 3.2 oz (82.2 kg), SpO2 100 %. Gen:      No acute distress HEENT:  EOMI, sclera anicteric Neck:     No masses; no thyromegaly Lungs:    Clear to auscultation bilaterally; normal respiratory effort CV:         Regular rate and rhythm; no murmurs Abd:      + bowel sounds; soft, non-tender; no palpable masses, no distension Ext:    No edema; adequate peripheral perfusion Skin:      Warm and dry; no rash Neuro: alert and oriented x 3 Psych: normal mood and affect  Data Reviewed: Imaging: CTA 03/03/2020-no PE, lungs are clear I have reviewed the images personally  PFTs:  Act score 04/25/2020-17  Labs:  Assessment:  Asthma Worsened after recent COVID-19 infection Fortunately she does not have any post-COVID ILD or fibrosis on CT in February  Currently on Flovent inhaler with persistent symptoms Stop Flovent, start Symbicort Check CBC differential, IgE Schedule PFTs  Plan/Recommendations: Start Symbicort instead of Flovent CBC, IgE, PFTs  Marshell Garfinkel MD [facility] 04/25/2020, 9:13 AM  CC: Eulogio Bear, NP

## 2020-04-25 NOTE — Addendum Note (Signed)
Addended by: Gavin Potters R on: 04/25/2020 10:02 AM   Modules accepted: Orders

## 2020-04-25 NOTE — Addendum Note (Signed)
Addended by: Gavin Potters R on: 04/25/2020 09:56 AM   Modules accepted: Orders

## 2020-04-25 NOTE — Patient Instructions (Signed)
We will start you on an inhaler called Symbicort 160.  Use 2 puffs twice daily.  You can use extra puffs as needed as a rescue medication Check CBC with differential, IgE Return to clinic in 2 to 3 months with PFTs

## 2020-04-26 LAB — IGE: IgE (Immunoglobulin E), Serum: 7 kU/L (ref <=114)

## 2020-08-10 DIAGNOSIS — G44209 Tension-type headache, unspecified, not intractable: Secondary | ICD-10-CM | POA: Insufficient documentation

## 2020-10-23 ENCOUNTER — Other Ambulatory Visit: Payer: Self-pay | Admitting: Nurse Practitioner

## 2020-10-23 DIAGNOSIS — J40 Bronchitis, not specified as acute or chronic: Secondary | ICD-10-CM

## 2021-04-05 IMAGING — DX DG CHEST 2V
2 series · 2 of 2 positions shown · non-contrast
Comparison: CT 03/12/2005

CLINICAL DATA: Persistent cough for 1 month

EXAM:
CHEST - 2 VIEW

[dg chest 2 view (1 of 2)]
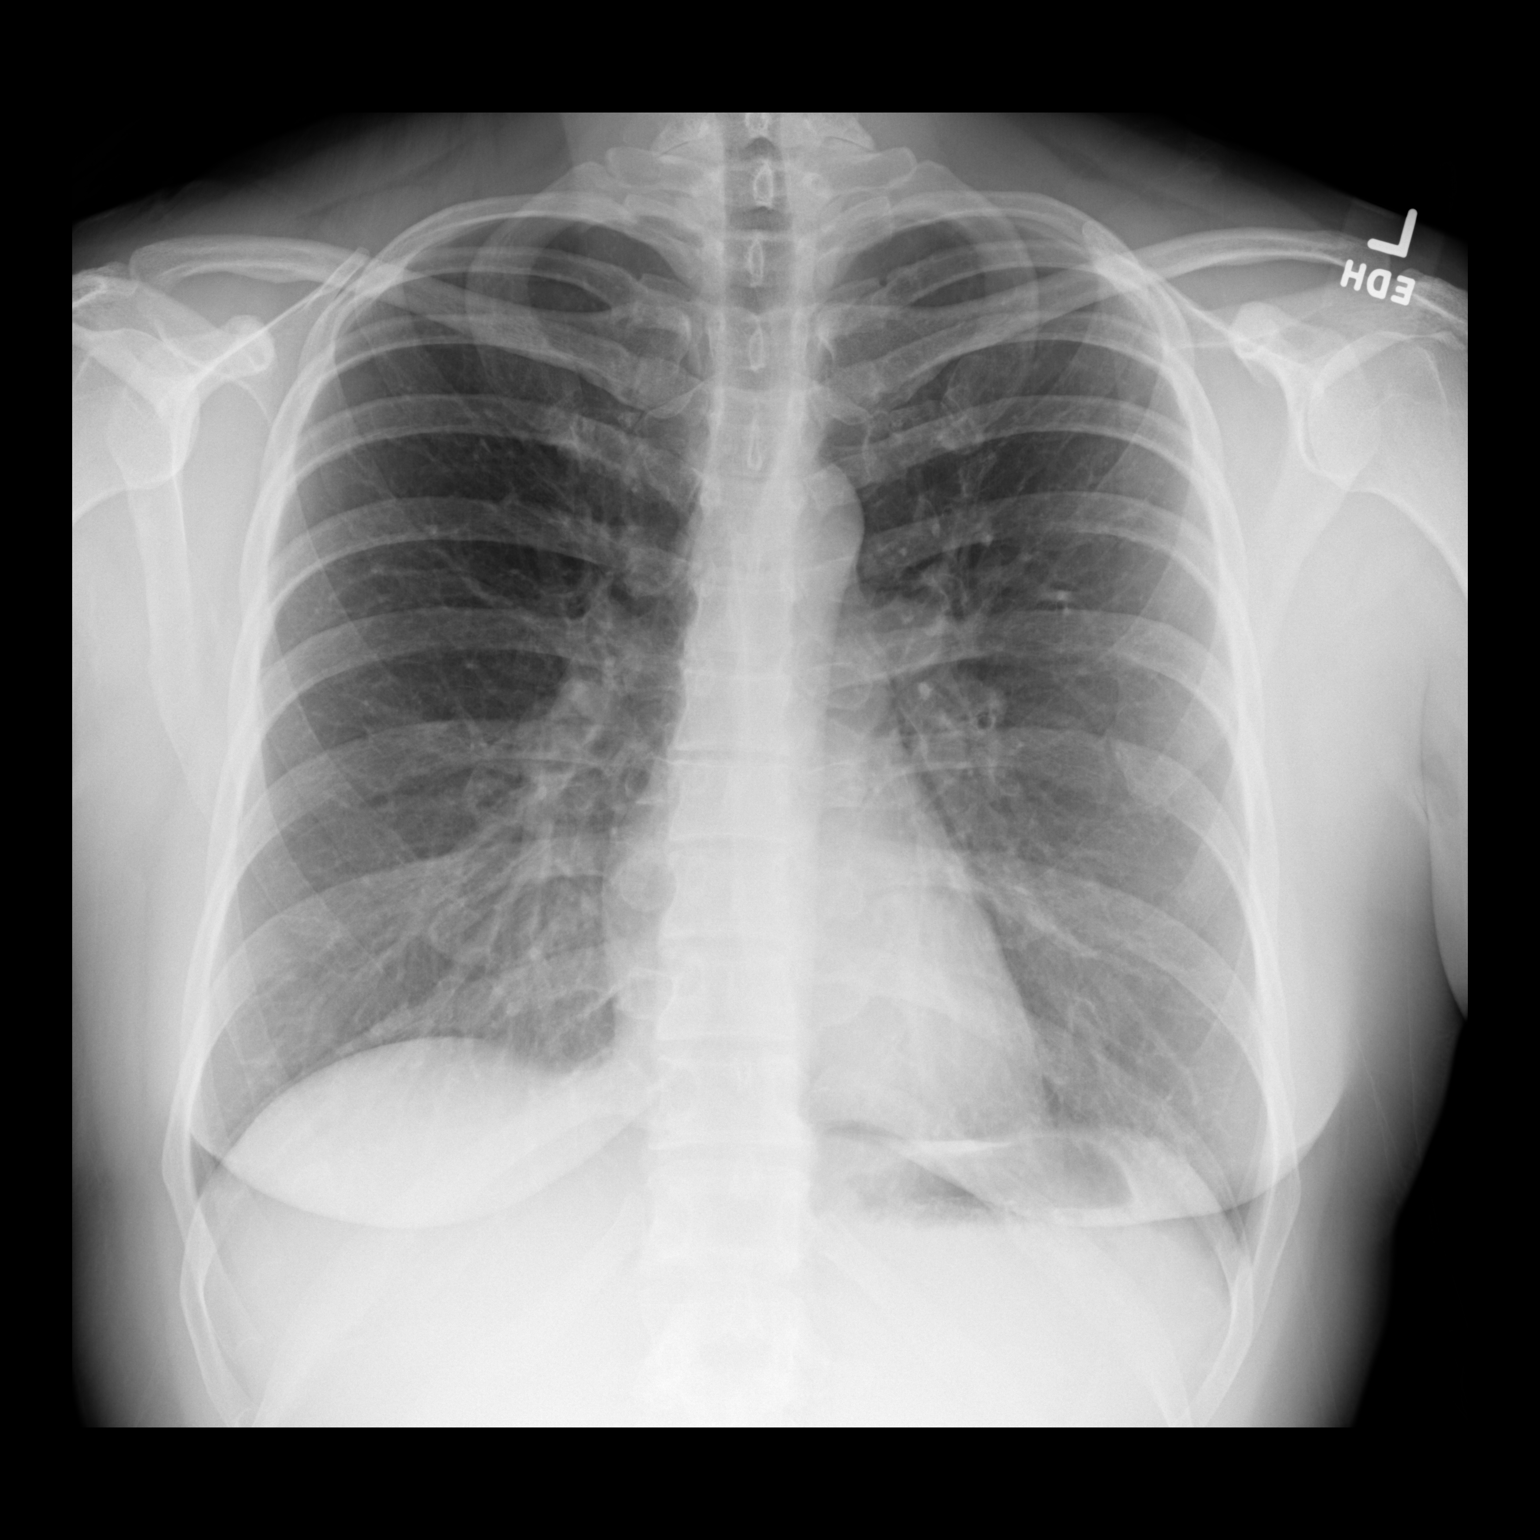

[dg chest 2 view (2 of 2)]
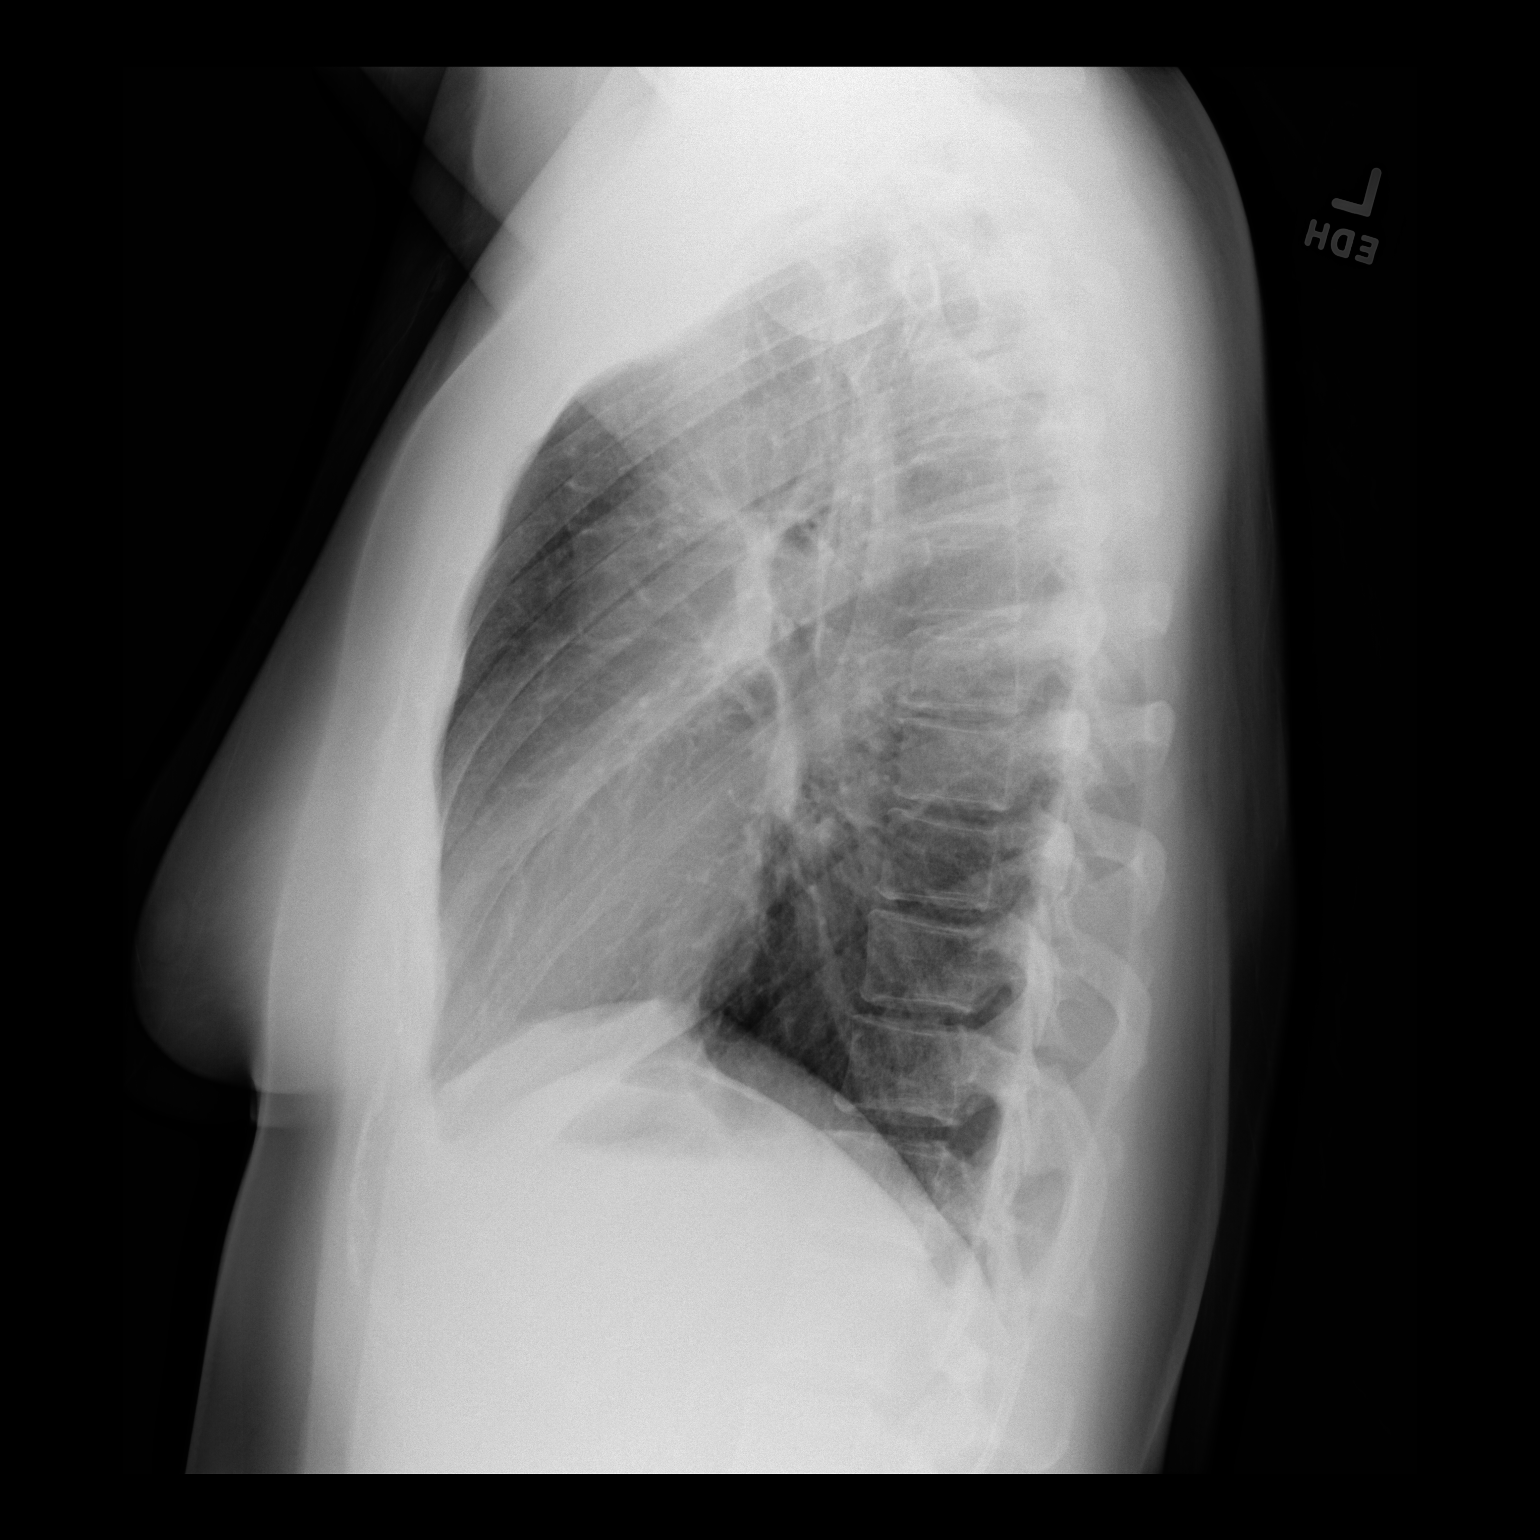

[2 of 2 positions shown; findings below may reference images not displayed]

FINDINGS: The heart size and mediastinal contours are within normal limits.
Both lungs are clear. The visualized skeletal structures are
unremarkable.
IMPRESSION: No active cardiopulmonary disease.

## 2021-04-19 IMAGING — CR DG CHEST 2V
2 series · 2 of 2 positions shown · non-contrast
Comparison: February 18, 2020

CLINICAL DATA: Shortness of breath and cough

EXAM:
CHEST - 2 VIEW

[chest pa]
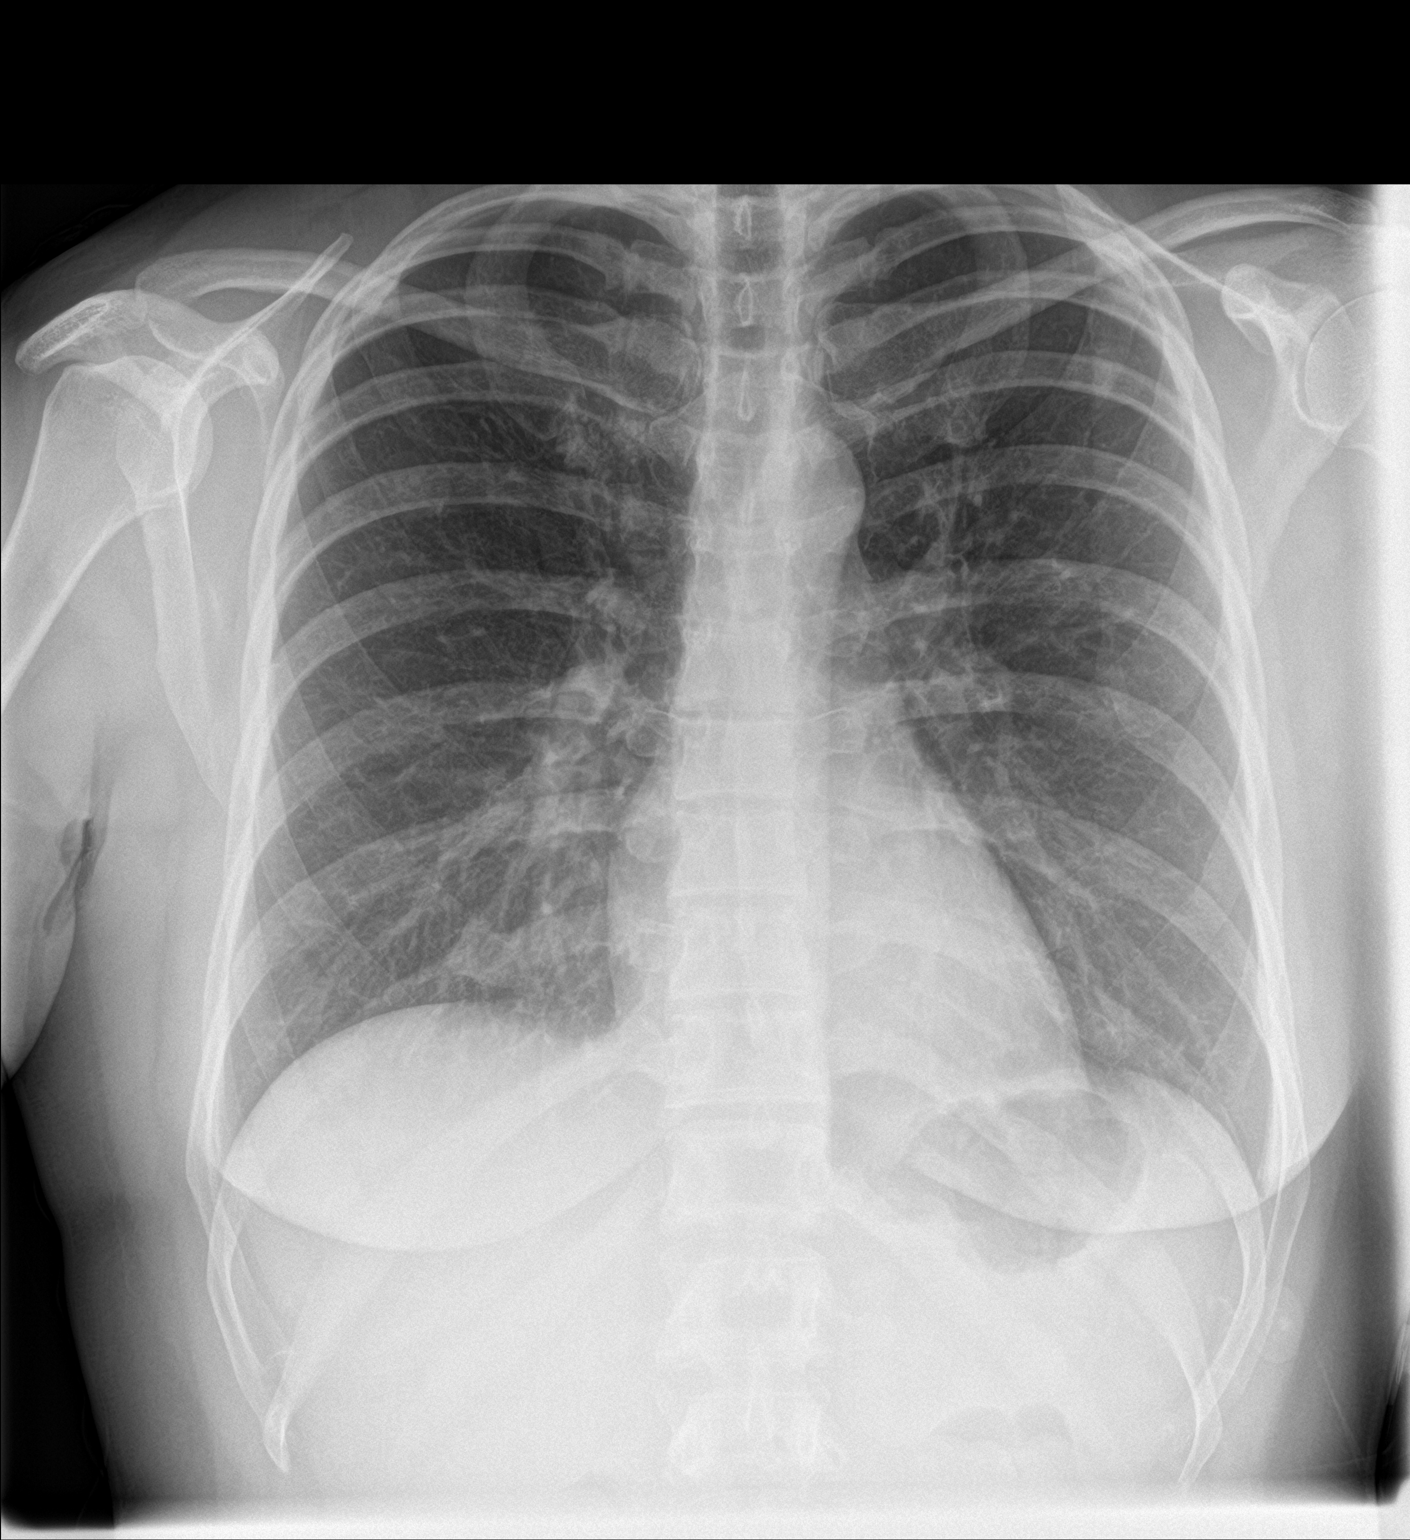

[chest lat]
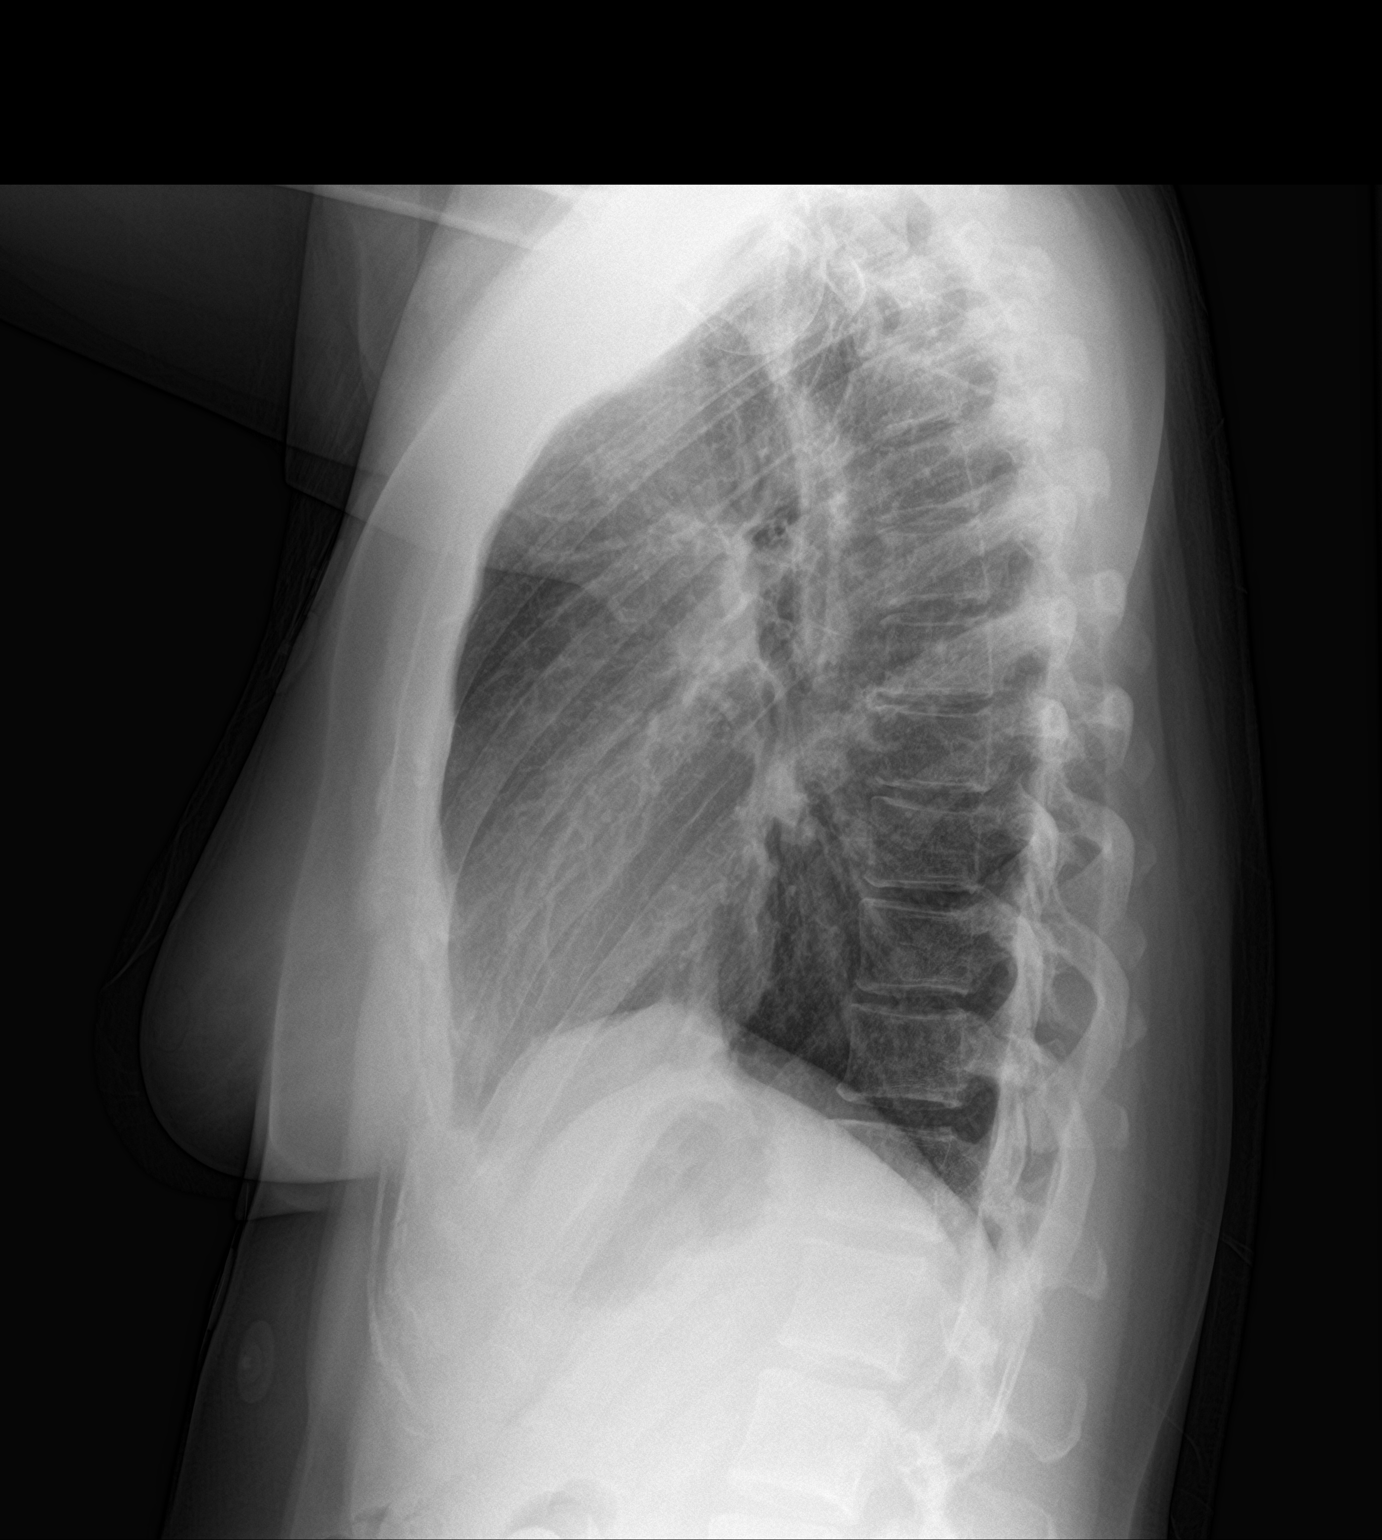

[2 of 2 positions shown; findings below may reference images not displayed]

FINDINGS: The lungs are clear. The heart size and pulmonary vascularity are
normal. No adenopathy. No bone lesions.
IMPRESSION: Lungs clear.  Cardiac silhouette normal.

## 2021-04-19 IMAGING — CT CT ANGIO CHEST
2 of 6 series · 18 of 46 positions shown · IV contrast (omnipaque)
Comparison: None.

CLINICAL DATA: Left-sided chest pain

EXAM:
CT ANGIOGRAPHY CHEST WITH CONTRAST
TECHNIQUE: Multidetector CT imaging of the chest was performed using the
standard protocol during bolus administration of intravenous
contrast. Multiplanar CT image reconstructions and MIPs were
obtained to evaluate the vascular anatomy.
CONTRAST:  75mL OMNIPAQUE IOHEXOL 350 MG/ML SOLN

[Series 6: thins · axial · 0.76mm/px · z∈[+1361,+1598]mm · 15 of 261 slices shown]
[im 12/261  lung]
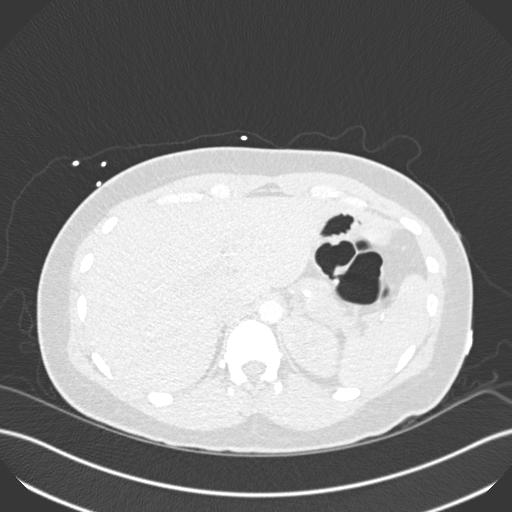
[im 34/261  soft-tissue]
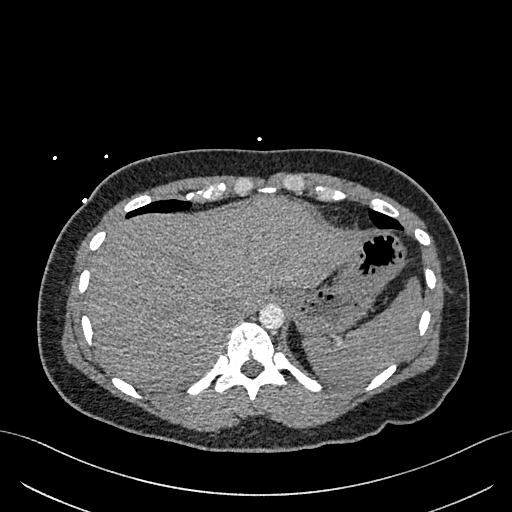
[im 46/261  lung]
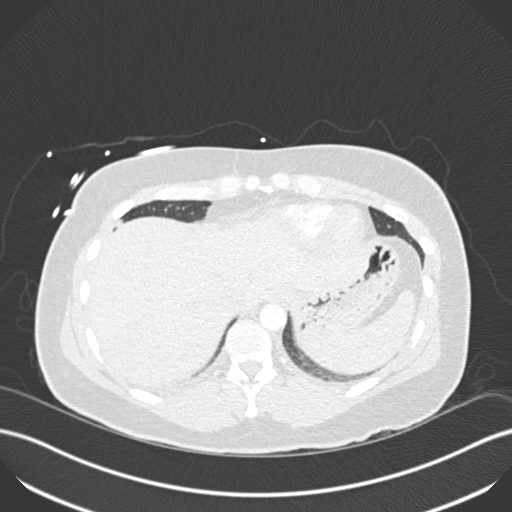
[im 68/261  soft-tissue]
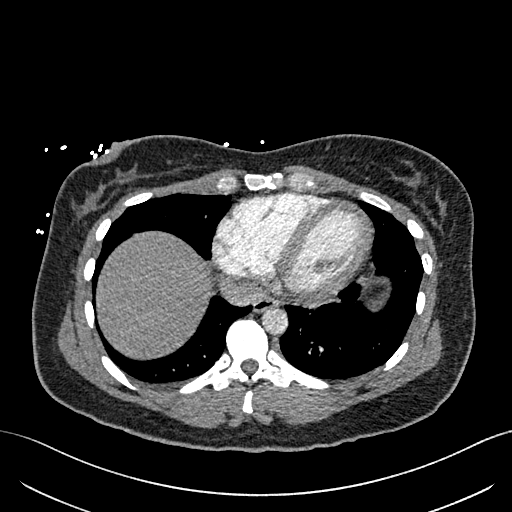
[im 80/261  lung]
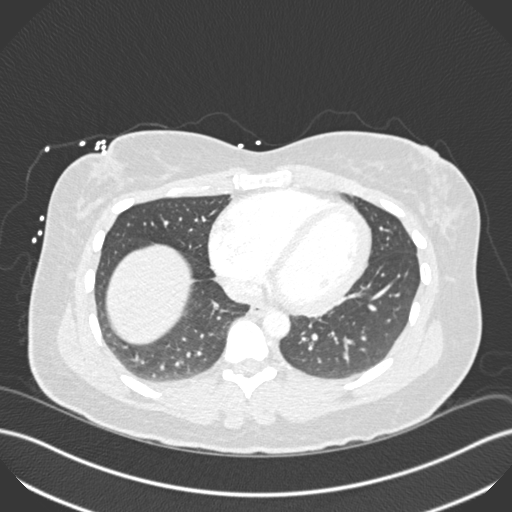
[im 102/261  soft-tissue]
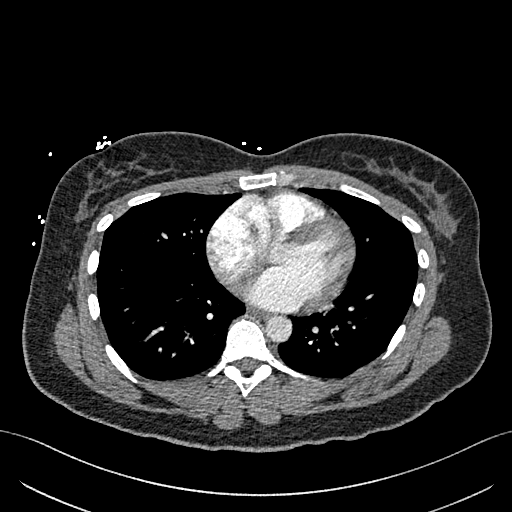
[im 114/261  lung]
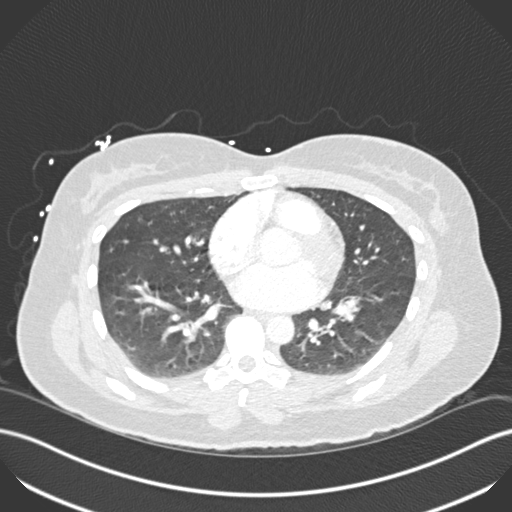
[im 136/261  soft-tissue]
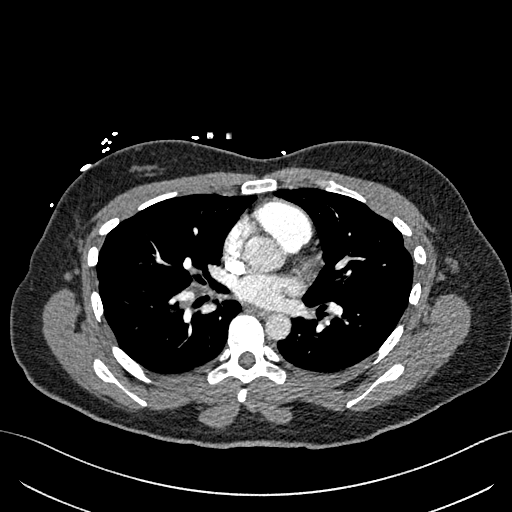
[im 147/261  lung]
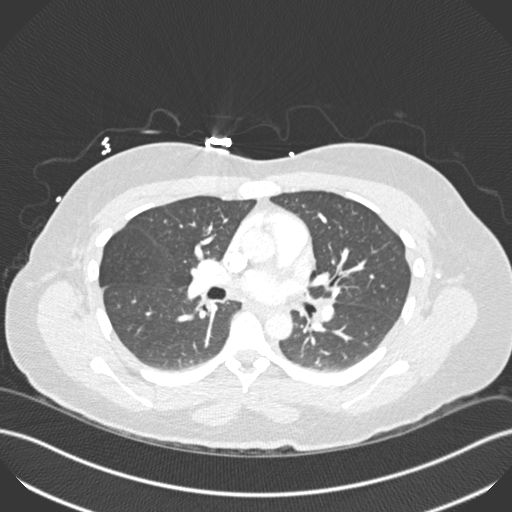
[im 159/261  soft-tissue]
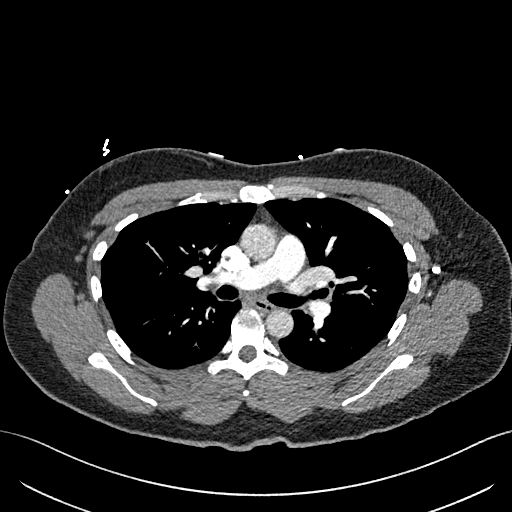
[im 181/261  lung]
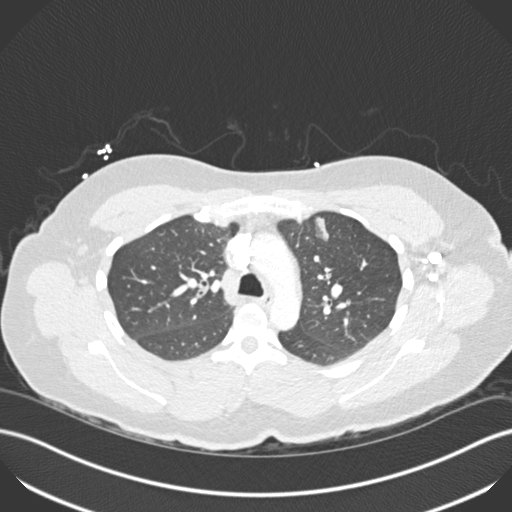
[im 193/261  soft-tissue]
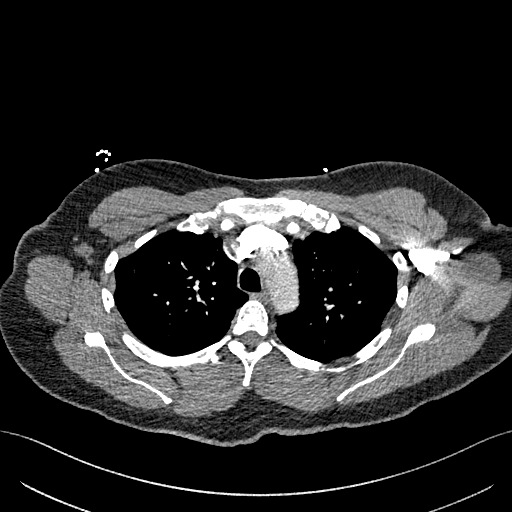
[im 215/261  lung]
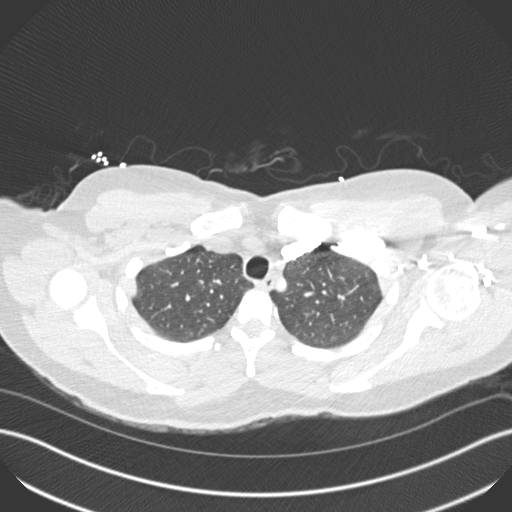
[im 227/261  soft-tissue]
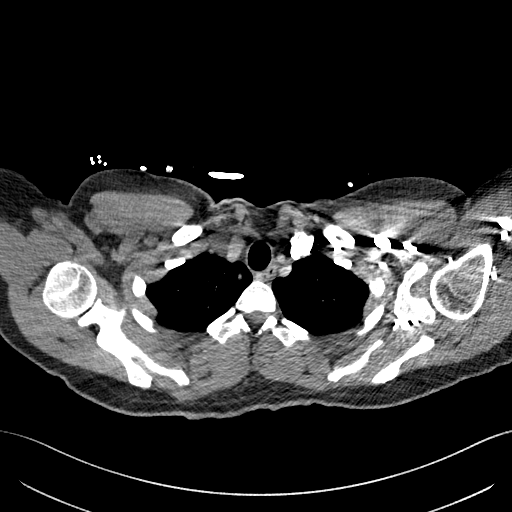
[im 249/261  lung]
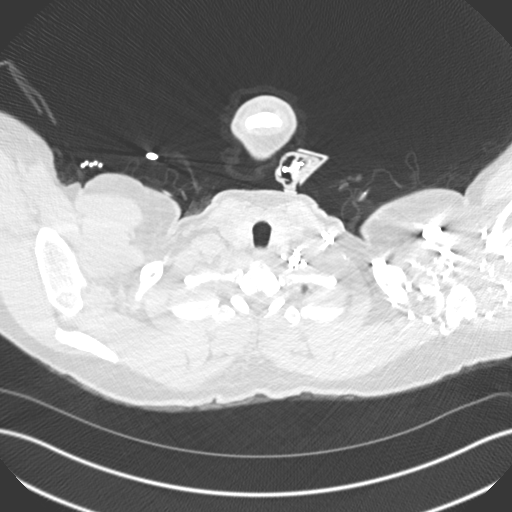

[Series 8: coronal mpr · coronal · 0.53mm/px · 3 of 124 slices shown]
[im 31/124  soft-tissue]
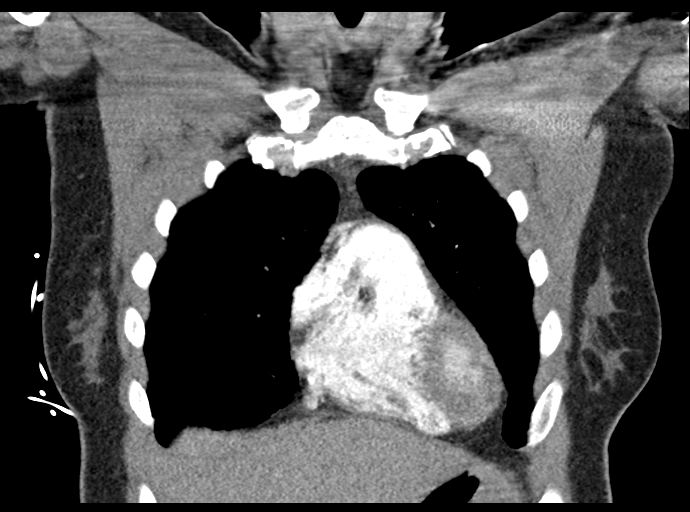
[im 62/124  soft-tissue]
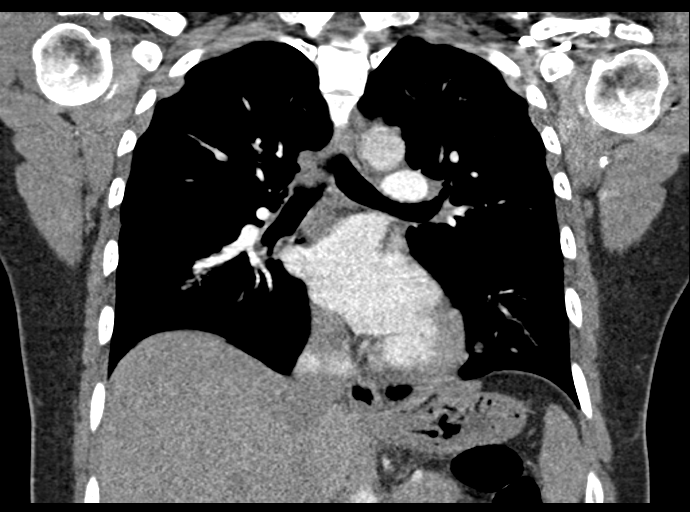
[im 93/124  soft-tissue]
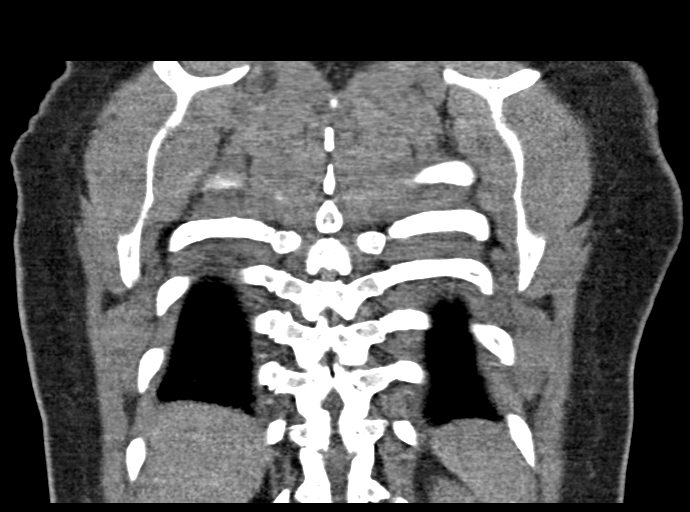

[18 of 46 positions shown; findings below may reference images not displayed]

FINDINGS: Cardiovascular: There is slightly suboptimal opacification of the
main pulmonary artery, however no central or proximal segmental
pulmonary embolism. The heart is normal in size. No pericardial
effusion or thickening. No evidence right heart strain. There is
normal three-vessel brachiocephalic anatomy without proximal
stenosis. The thoracic aorta is normal in appearance.

Mediastinum/Nodes: No hilar, mediastinal, or axillary adenopathy.
Thyroid gland, trachea, and esophagus demonstrate no significant
findings.

Lungs/Pleura: The lungs are clear. No pleural effusion or
pneumothorax. No airspace consolidation.

Upper Abdomen: No acute abnormalities present in the visualized
portions of the upper abdomen.

Musculoskeletal: No chest wall abnormality. No acute or significant
osseous findings.

Review of the MIP images confirms the above findings.
IMPRESSION: No central or proximal segmental pulmonary embolism

No acute intrathoracic pathology to explain the patient's symptoms.

## 2021-12-31 DIAGNOSIS — J45998 Other asthma: Secondary | ICD-10-CM | POA: Diagnosis not present

## 2022-02-05 ENCOUNTER — Ambulatory Visit: Payer: Managed Care, Other (non HMO) | Admitting: Family Medicine

## 2022-02-05 ENCOUNTER — Encounter: Payer: Self-pay | Admitting: Family Medicine

## 2022-02-05 VITALS — BP 98/60 | HR 77 | Temp 97.5°F | Ht 67.0 in | Wt 197.0 lb

## 2022-02-05 DIAGNOSIS — R1011 Right upper quadrant pain: Secondary | ICD-10-CM

## 2022-02-05 NOTE — Assessment & Plan Note (Addendum)
Patient presents with 10 days of RUQ "heaviness". She did have a stomach ache with nausea and fatigue that is improving but RUQ discomfort remains. Will obtain CBC and CMP and RUQ [place]. Instructed to seek emergent medical care for acute worsening or persistence of abdominal pain, fevers, nausea, vomiting, inability to tolerate food or have a BM.

## 2022-02-05 NOTE — Progress Notes (Signed)
Acute Office Visit  Subjective:     Patient ID: Tammy Dorsey, female    DOB: 1980-10-19, 42 y.o.   MRN: [id number]  Chief Complaint  Patient presents with   Acute Visit    having some stomach pains/upper right stomach area started Jan 1. Lasting until last Thursday.   Feel a little nausea, not feeling like eating. Stomach pain went always however the upper pain still around    HPI Patient is in today for 1.5 weeks of a "stomach ache", RUQ pain, fatigue. Endorses nausea, no vomiting, indigestion, headaches, and chills. Denies fevers, body aches. She has been able to eat but appetite somewhat diminished. She describes the RUQ pain as a "heaviness" that is constant, not improving or getting worse, 4-5/10 pain to as bad as 7/10. Normal BMs. No known sick exposures. She has tried nothing.   Review of Systems  All other systems reviewed and are negative.   Past Medical History:  Diagnosis Date   Allergic rhinitis    Asthma    Bronchitis due to COVID-19 virus 01/29/2020   Near syncope 07/14/2014   Past Surgical History:  Procedure Laterality Date   CESAREAN SECTION     WISDOM TOOTH EXTRACTION     Current Outpatient Medications on File Prior to Visit  Medication Sig Dispense Refill   albuterol (VENTOLIN HFA) 108 (90 Base) MCG/ACT inhaler TAKE 2 PUFFS BY MOUTH EVERY 6 HOURS AS NEEDED FOR WHEEZE OR SHORTNESS OF BREATH 6.7 each 1   fluticasone (FLOVENT HFA) 220 MCG/ACT inhaler Inhale 1 puff into the lungs every 12 (twelve) hours. Rinse mouth out with water after each use. 1 each 3   albuterol (PROVENTIL) (2.5 MG/3ML) 0.083% nebulizer solution Take 3 mLs (2.5 mg total) by nebulization every 6 (six) hours as needed for wheezing or shortness of breath. (Patient not taking: Reported on 02/05/2022) 150 mL 1   budesonide-formoterol (SYMBICORT) 160-4.5 MCG/ACT inhaler Inhale 2 puffs into the lungs 2 (two) times daily. (Patient not taking: Reported on 02/05/2022) 1 each 6   cyclobenzaprine  (FLEXERIL) 5 MG tablet Take 1-2 tablets (5-10 mg total) by mouth 2 (two) times daily as needed for muscle spasms. (Patient not taking: Reported on 02/05/2022) 30 tablet 0   fluticasone (FLONASE) 50 MCG/ACT nasal spray Place 2 sprays into both nostrils daily. (Patient not taking: Reported on 02/05/2022) 16 g 6   Multiple Vitamin (MULTI-VITAMIN) tablet Take 1 tablet by mouth daily. (Patient not taking: Reported on 02/05/2022)     No current facility-administered medications on file prior to visit.   Allergies  Allergen Reactions   Aleve-D Sinus & Cold [Pseudoephedrine-Naproxen Na Er]         Objective:    BP 98/60   Pulse 77   Temp (!) 97.5 F (36.4 C) (Oral)   Ht '5\' 7"'$  (7.915 m)   Wt 197 lb (89.4 kg)   SpO2 99%   BMI 30.85 kg/m    Physical Exam Vitals and nursing note reviewed.  Constitutional:      Appearance: Normal appearance. She is normal weight.  HENT:     Head: Normocephalic and atraumatic.  Abdominal:     General: Bowel sounds are normal. There is no distension.     Palpations: Abdomen is soft. There is no hepatomegaly, splenomegaly or mass.     Tenderness: There is no abdominal tenderness.  Skin:    General: Skin is warm and dry.  Neurological:     General: No focal deficit present.  Mental Status: She is alert and oriented to person, place, and time. Mental status is at baseline.  Psychiatric:        Mood and Affect: Mood normal.        Behavior: Behavior normal.        Thought Content: Thought content normal.        Judgment: Judgment normal.     No results found for any visits on 02/05/22.      Assessment & Plan:   Problem List Items Addressed This Visit       Other   Right upper quadrant abdominal pain - Primary    Patient presents with 10 days of RUQ "heaviness". She did have a stomach ache with nausea and fatigue that is improving but RUQ discomfort remains. Will obtain CBC and CMP and RUQ [place]. Instructed to seek emergent medical care for acute  worsening or persistence of abdominal pain, fevers, nausea, vomiting, inability to tolerate food or have a BM.      Relevant Orders   COMPLETE METABOLIC PANEL WITH GFR   CBC with Differential/Platelet   US Abdomen Limited RUQ (LIVER/GB)    No orders of the defined types were placed in this encounter.   Return if symptoms worsen or fail to improve.  Rubie Maid, FNP

## 2022-02-06 LAB — COMPLETE METABOLIC PANEL WITH GFR
AG Ratio: 1.6 (calc) (ref 1.0–2.5)
ALT: 18 U/L (ref 6–29)
AST: 18 U/L (ref 10–30)
Albumin: 4 g/dL (ref 3.6–5.1)
Alkaline phosphatase (APISO): 40 U/L (ref 31–125)
BUN: 19 mg/dL (ref 7–25)
CO2: 29 mmol/L (ref 20–32)
Calcium: 9.2 mg/dL (ref 8.6–10.2)
Chloride: 103 mmol/L (ref 98–110)
Creat: 0.87 mg/dL (ref 0.50–0.99)
Globulin: 2.5 g/dL (calc) (ref 1.9–3.7)
Glucose, Bld: 95 mg/dL (ref 65–99)
Potassium: 4.2 mmol/L (ref 3.5–5.3)
Sodium: 138 mmol/L (ref 135–146)
Total Bilirubin: 0.3 mg/dL (ref 0.2–1.2)
Total Protein: 6.5 g/dL (ref 6.1–8.1)
eGFR: 86 mL/min/{1.73_m2} (ref >=60)

## 2022-02-06 LAB — CBC WITH DIFFERENTIAL/PLATELET
Absolute Monocytes: 497 cells/uL (ref 200–950)
Basophils Absolute: 37 cells/uL (ref 0–200)
Basophils Relative: 0.4 %
Eosinophils Absolute: 331 cells/uL (ref 15–500)
Eosinophils Relative: 3.6 %
HCT: 39 % (ref 35.0–45.0)
Hemoglobin: 13 g/dL (ref 11.7–15.5)
Lymphs Abs: 2558 cells/uL (ref 850–3900)
MCH: 30.1 pg (ref 27.0–33.0)
MCHC: 33.3 g/dL (ref 32.0–36.0)
MCV: 90.3 fL (ref 80.0–100.0)
MPV: 10.4 fL (ref 7.5–12.5)
Monocytes Relative: 5.4 %
Neutro Abs: 5778 cells/uL (ref 1500–7800)
Neutrophils Relative %: 62.8 %
Platelets: 294 10*3/uL (ref 140–400)
RBC: 4.32 10*6/uL (ref 3.80–5.10)
RDW: 11.5 % (ref 11.0–15.0)
Total Lymphocyte: 27.8 %
WBC: 9.2 10*3/uL (ref 3.8–10.8)

## 2022-02-14 ENCOUNTER — Other Ambulatory Visit: Payer: BC Managed Care – PPO

## 2022-03-01 ENCOUNTER — Ambulatory Visit
Admission: RE | Admit: 2022-03-01 | Discharge: 2022-03-01 | Disposition: A | Discharge status: Home / Self Care | Payer: BC Managed Care – PPO | Source: Ambulatory Visit | Attending: Family Medicine | Admitting: Family Medicine

## 2022-03-01 DIAGNOSIS — R1011 Right upper quadrant pain: Secondary | ICD-10-CM | POA: Diagnosis not present

## 2022-06-25 ENCOUNTER — Ambulatory Visit: Payer: BC Managed Care – PPO | Admitting: Family Medicine

## 2022-06-25 VITALS — BP 114/60 | HR 65 | Temp 98.2°F | Ht 67.0 in | Wt 182.2 lb

## 2022-06-25 DIAGNOSIS — K219 Gastro-esophageal reflux disease without esophagitis: Secondary | ICD-10-CM | POA: Diagnosis not present

## 2022-06-25 NOTE — Assessment & Plan Note (Addendum)
No red flags on exam. Discussed the pathophysiology of reflux.  Anti-reflux measures such as raising the head of the bed, avoiding tight clothing or belts, avoiding eating late at night and not lying down shortly after mealtime and achieving weight loss are discussed. Avoid ASA, NSAID's, caffeine, peppermints, alcohol and tobacco. OTC H2 blockers and/or antacids are often very helpful for PRN use. However, for persisting chronic or daily symptoms, prescription strength H2 blockers or a trial of PPI's are often used. She should alert me if there are persistent symptoms, dysphagia, weight loss or GI bleeding. Follow up if symptoms worse or do not improve in 1 week. Provided with Nurtec sample for migraines and encouraged to avoid Excedrin for now.

## 2022-06-25 NOTE — Progress Notes (Signed)
Subjective:  HPI: Tammy Dorsey is a 42 y.o. female presenting on 06/25/2022 for Abdominal Pain (Pt c/o nausea and abdominal pain off and on x 1 week. Pt states pain is mid upper abdomen and radiates to both sides. Pt increased burping and abdominal bloating. )   HPI Patient is in today for abdominal pain and nausea intermittent for 1 week. Associated with bloating, burping, and indigestion. Unable to identitfy any triggers, has not changed her eating habits. She does report more frequent migraines associated with her wheat allergy and she has been using Excedrin more frequently. Denies vomiting, diarrhea, fevers, sick exposures. Has tried Pepcid since last Thursday and is seeing improvement today.  Review of Systems  All other systems reviewed and are negative.   Relevant past medical history reviewed and updated as indicated.   Past Medical History:  Diagnosis Date   Allergic rhinitis    Asthma    Bronchitis due to COVID-19 virus 01/29/2020   Near syncope 07/14/2014     Past Surgical History:  Procedure Laterality Date   CESAREAN SECTION     WISDOM TOOTH EXTRACTION      Allergies and medications reviewed and updated.   Current Outpatient Medications:    albuterol (PROVENTIL) (2.5 MG/3ML) 0.083% nebulizer solution, Take 3 mLs (2.5 mg total) by nebulization every 6 (six) hours as needed for wheezing or shortness of breath., Disp: 150 mL, Rfl: 1   albuterol (VENTOLIN HFA) 108 (90 Base) MCG/ACT inhaler, TAKE 2 PUFFS BY MOUTH EVERY 6 HOURS AS NEEDED FOR WHEEZE OR SHORTNESS OF BREATH, Disp: 6.7 each, Rfl: 1   budesonide-formoterol (SYMBICORT) 160-4.5 MCG/ACT inhaler, Inhale 2 puffs into the lungs 2 (two) times daily., Disp: 1 each, Rfl: 6   fluticasone (FLOVENT HFA) 220 MCG/ACT inhaler, Inhale 1 puff into the lungs every 12 (twelve) hours. Rinse mouth out with water after each use., Disp: 1 each, Rfl: 3   Multiple Vitamin (MULTI-VITAMIN) tablet, Take 1 tablet by mouth daily.,  Disp: , Rfl:    cyclobenzaprine (FLEXERIL) 5 MG tablet, Take 1-2 tablets (5-10 mg total) by mouth 2 (two) times daily as needed for muscle spasms. (Patient not taking: Reported on 02/05/2022), Disp: 30 tablet, Rfl: 0   fluticasone (FLONASE) 50 MCG/ACT nasal spray, Place 2 sprays into both nostrils daily. (Patient not taking: Reported on 02/05/2022), Disp: 16 g, Rfl: 6  Allergies  Allergen Reactions   Aleve-D Sinus & Cold [Pseudoephedrine-Naproxen Na Er]    Wheat Other (See Comments)    Objective:   BP 114/60   Pulse 65   Temp 98.2 F (36.8 C)   Ht 5\' 7"  (1.702 m)   Wt 182 lb 3.2 oz (82.6 kg)   SpO2 98%   BMI 28.54 kg/m      06/25/2022   12:01 PM 02/05/2022    2:29 PM 04/25/2020    9:08 AM  Vitals with BMI  Height 5\' 7"  5\' 7"  5\' 7"   Weight 182 lbs 3 oz 197 lbs 181 lbs 3 oz  BMI 28.53 30.85 28.37  Systolic 114 98 118  Diastolic 60 60 74  Pulse 65 77 77     Physical Exam Vitals and nursing note reviewed.  Constitutional:      Appearance: Normal appearance. She is normal weight.  HENT:     Head: Normocephalic and atraumatic.  Cardiovascular:     Rate and Rhythm: Normal rate and regular rhythm.     Pulses: Normal pulses.     Heart sounds: Normal  heart sounds.  Pulmonary:     Effort: Pulmonary effort is normal.     Breath sounds: Normal breath sounds.  Abdominal:     General: Bowel sounds are normal. There is no distension.     Palpations: Abdomen is soft.     Tenderness: There is no abdominal tenderness.  Skin:    General: Skin is warm and dry.  Neurological:     General: No focal deficit present.     Mental Status: She is alert and oriented to person, place, and time. Mental status is at baseline.  Psychiatric:        Mood and Affect: Mood normal.        Behavior: Behavior normal.        Thought Content: Thought content normal.        Judgment: Judgment normal.     Assessment & Plan:  Gastroesophageal reflux disease without esophagitis Assessment & Plan: No  red flags on exam. Discussed the pathophysiology of reflux.  Anti-reflux measures such as raising the head of the bed, avoiding tight clothing or belts, avoiding eating late at night and not lying down shortly after mealtime and achieving weight loss are discussed. Avoid ASA, NSAID's, caffeine, peppermints, alcohol and tobacco. OTC H2 blockers and/or antacids are often very helpful for PRN use. However, for persisting chronic or daily symptoms, prescription strength H2 blockers or a trial of PPI's are often used. She should alert me if there are persistent symptoms, dysphagia, weight loss or GI bleeding. Follow up if symptoms worse or do not improve in 1 week. Provided with Nurtec sample for migraines and encouraged to avoid Excedrin for now.       Follow up plan: Return if symptoms worsen or fail to improve.  Park Meo, FNP

## 2022-10-21 DIAGNOSIS — J45998 Other asthma: Secondary | ICD-10-CM | POA: Diagnosis not present

## 2023-02-07 DIAGNOSIS — F329 Major depressive disorder, single episode, unspecified: Secondary | ICD-10-CM | POA: Diagnosis not present

## 2023-02-07 DIAGNOSIS — R790 Abnormal level of blood mineral: Secondary | ICD-10-CM | POA: Diagnosis not present

## 2023-02-07 DIAGNOSIS — E559 Vitamin D deficiency, unspecified: Secondary | ICD-10-CM | POA: Diagnosis not present

## 2023-02-07 DIAGNOSIS — Z13220 Encounter for screening for lipoid disorders: Secondary | ICD-10-CM | POA: Diagnosis not present

## 2023-02-07 DIAGNOSIS — N951 Menopausal and female climacteric states: Secondary | ICD-10-CM | POA: Diagnosis not present

## 2023-02-07 DIAGNOSIS — R635 Abnormal weight gain: Secondary | ICD-10-CM | POA: Diagnosis not present

## 2023-02-07 DIAGNOSIS — Z6830 Body mass index (BMI) 30.0-30.9, adult: Secondary | ICD-10-CM | POA: Diagnosis not present

## 2023-02-07 DIAGNOSIS — Z131 Encounter for screening for diabetes mellitus: Secondary | ICD-10-CM | POA: Diagnosis not present

## 2023-02-07 DIAGNOSIS — G479 Sleep disorder, unspecified: Secondary | ICD-10-CM | POA: Diagnosis not present

## 2023-02-12 DIAGNOSIS — R635 Abnormal weight gain: Secondary | ICD-10-CM | POA: Diagnosis not present

## 2023-02-12 DIAGNOSIS — J45909 Unspecified asthma, uncomplicated: Secondary | ICD-10-CM | POA: Diagnosis not present

## 2023-02-12 DIAGNOSIS — E669 Obesity, unspecified: Secondary | ICD-10-CM | POA: Diagnosis not present

## 2023-02-12 DIAGNOSIS — N951 Menopausal and female climacteric states: Secondary | ICD-10-CM | POA: Diagnosis not present

## 2023-02-12 DIAGNOSIS — K219 Gastro-esophageal reflux disease without esophagitis: Secondary | ICD-10-CM | POA: Diagnosis not present

## 2023-02-12 DIAGNOSIS — Z1331 Encounter for screening for depression: Secondary | ICD-10-CM | POA: Diagnosis not present

## 2023-02-19 DIAGNOSIS — Z6829 Body mass index (BMI) 29.0-29.9, adult: Secondary | ICD-10-CM | POA: Diagnosis not present

## 2023-02-19 DIAGNOSIS — Z789 Other specified health status: Secondary | ICD-10-CM | POA: Diagnosis not present

## 2023-02-26 DIAGNOSIS — K219 Gastro-esophageal reflux disease without esophagitis: Secondary | ICD-10-CM | POA: Diagnosis not present

## 2023-02-26 DIAGNOSIS — Z6829 Body mass index (BMI) 29.0-29.9, adult: Secondary | ICD-10-CM | POA: Diagnosis not present

## 2023-03-12 DIAGNOSIS — E559 Vitamin D deficiency, unspecified: Secondary | ICD-10-CM | POA: Diagnosis not present

## 2023-03-12 DIAGNOSIS — K219 Gastro-esophageal reflux disease without esophagitis: Secondary | ICD-10-CM | POA: Diagnosis not present

## 2023-03-12 DIAGNOSIS — N951 Menopausal and female climacteric states: Secondary | ICD-10-CM | POA: Diagnosis not present

## 2023-03-12 DIAGNOSIS — Z6830 Body mass index (BMI) 30.0-30.9, adult: Secondary | ICD-10-CM | POA: Diagnosis not present

## 2023-03-19 DIAGNOSIS — Z6830 Body mass index (BMI) 30.0-30.9, adult: Secondary | ICD-10-CM | POA: Diagnosis not present

## 2023-03-19 DIAGNOSIS — Z789 Other specified health status: Secondary | ICD-10-CM | POA: Diagnosis not present

## 2023-04-02 DIAGNOSIS — Z6830 Body mass index (BMI) 30.0-30.9, adult: Secondary | ICD-10-CM | POA: Diagnosis not present

## 2023-04-02 DIAGNOSIS — E559 Vitamin D deficiency, unspecified: Secondary | ICD-10-CM | POA: Diagnosis not present

## 2023-04-09 DIAGNOSIS — R790 Abnormal level of blood mineral: Secondary | ICD-10-CM | POA: Diagnosis not present

## 2023-04-09 DIAGNOSIS — N951 Menopausal and female climacteric states: Secondary | ICD-10-CM | POA: Diagnosis not present

## 2023-04-09 DIAGNOSIS — Z6829 Body mass index (BMI) 29.0-29.9, adult: Secondary | ICD-10-CM | POA: Diagnosis not present

## 2023-04-09 DIAGNOSIS — E559 Vitamin D deficiency, unspecified: Secondary | ICD-10-CM | POA: Diagnosis not present

## 2023-04-12 DIAGNOSIS — G479 Sleep disorder, unspecified: Secondary | ICD-10-CM | POA: Diagnosis not present

## 2023-04-12 DIAGNOSIS — N951 Menopausal and female climacteric states: Secondary | ICD-10-CM | POA: Diagnosis not present

## 2023-04-12 DIAGNOSIS — R232 Flushing: Secondary | ICD-10-CM | POA: Diagnosis not present

## 2023-04-15 DIAGNOSIS — Z6830 Body mass index (BMI) 30.0-30.9, adult: Secondary | ICD-10-CM | POA: Diagnosis not present

## 2023-04-15 DIAGNOSIS — F329 Major depressive disorder, single episode, unspecified: Secondary | ICD-10-CM | POA: Diagnosis not present

## 2023-05-07 DIAGNOSIS — Z789 Other specified health status: Secondary | ICD-10-CM | POA: Diagnosis not present

## 2023-05-07 DIAGNOSIS — Z6829 Body mass index (BMI) 29.0-29.9, adult: Secondary | ICD-10-CM | POA: Diagnosis not present

## 2023-05-28 DIAGNOSIS — K219 Gastro-esophageal reflux disease without esophagitis: Secondary | ICD-10-CM | POA: Diagnosis not present

## 2023-05-28 DIAGNOSIS — Z6829 Body mass index (BMI) 29.0-29.9, adult: Secondary | ICD-10-CM | POA: Diagnosis not present

## 2023-05-28 DIAGNOSIS — E559 Vitamin D deficiency, unspecified: Secondary | ICD-10-CM | POA: Diagnosis not present

## 2023-06-11 DIAGNOSIS — Z6829 Body mass index (BMI) 29.0-29.9, adult: Secondary | ICD-10-CM | POA: Diagnosis not present

## 2023-06-11 DIAGNOSIS — R790 Abnormal level of blood mineral: Secondary | ICD-10-CM | POA: Diagnosis not present

## 2023-06-26 DIAGNOSIS — E559 Vitamin D deficiency, unspecified: Secondary | ICD-10-CM | POA: Diagnosis not present

## 2023-07-09 DIAGNOSIS — Z6829 Body mass index (BMI) 29.0-29.9, adult: Secondary | ICD-10-CM | POA: Diagnosis not present

## 2023-07-09 DIAGNOSIS — R790 Abnormal level of blood mineral: Secondary | ICD-10-CM | POA: Diagnosis not present

## 2023-07-09 DIAGNOSIS — E559 Vitamin D deficiency, unspecified: Secondary | ICD-10-CM | POA: Diagnosis not present

## 2023-07-09 DIAGNOSIS — N951 Menopausal and female climacteric states: Secondary | ICD-10-CM | POA: Diagnosis not present

## 2023-07-23 DIAGNOSIS — E559 Vitamin D deficiency, unspecified: Secondary | ICD-10-CM | POA: Diagnosis not present

## 2023-07-24 DIAGNOSIS — S335XXA Sprain of ligaments of lumbar spine, initial encounter: Secondary | ICD-10-CM | POA: Diagnosis not present

## 2023-07-24 DIAGNOSIS — M62830 Muscle spasm of back: Secondary | ICD-10-CM | POA: Diagnosis not present

## 2023-07-24 DIAGNOSIS — M25551 Pain in right hip: Secondary | ICD-10-CM | POA: Diagnosis not present

## 2023-07-31 DIAGNOSIS — M62830 Muscle spasm of back: Secondary | ICD-10-CM | POA: Diagnosis not present

## 2023-07-31 DIAGNOSIS — M25551 Pain in right hip: Secondary | ICD-10-CM | POA: Diagnosis not present

## 2023-07-31 DIAGNOSIS — S335XXA Sprain of ligaments of lumbar spine, initial encounter: Secondary | ICD-10-CM | POA: Diagnosis not present

## 2023-08-05 DIAGNOSIS — M25551 Pain in right hip: Secondary | ICD-10-CM | POA: Diagnosis not present

## 2023-08-05 DIAGNOSIS — M62830 Muscle spasm of back: Secondary | ICD-10-CM | POA: Diagnosis not present

## 2023-08-05 DIAGNOSIS — S335XXA Sprain of ligaments of lumbar spine, initial encounter: Secondary | ICD-10-CM | POA: Diagnosis not present

## 2023-08-06 DIAGNOSIS — R790 Abnormal level of blood mineral: Secondary | ICD-10-CM | POA: Diagnosis not present

## 2023-08-12 DIAGNOSIS — M62830 Muscle spasm of back: Secondary | ICD-10-CM | POA: Diagnosis not present

## 2023-08-12 DIAGNOSIS — S335XXA Sprain of ligaments of lumbar spine, initial encounter: Secondary | ICD-10-CM | POA: Diagnosis not present

## 2023-08-12 DIAGNOSIS — M25551 Pain in right hip: Secondary | ICD-10-CM | POA: Diagnosis not present

## 2023-08-19 DIAGNOSIS — M62830 Muscle spasm of back: Secondary | ICD-10-CM | POA: Diagnosis not present

## 2023-08-19 DIAGNOSIS — S335XXA Sprain of ligaments of lumbar spine, initial encounter: Secondary | ICD-10-CM | POA: Diagnosis not present

## 2023-08-19 DIAGNOSIS — M25551 Pain in right hip: Secondary | ICD-10-CM | POA: Diagnosis not present

## 2023-08-20 DIAGNOSIS — R790 Abnormal level of blood mineral: Secondary | ICD-10-CM | POA: Diagnosis not present

## 2023-08-20 DIAGNOSIS — Z789 Other specified health status: Secondary | ICD-10-CM | POA: Diagnosis not present

## 2023-08-20 DIAGNOSIS — Z6829 Body mass index (BMI) 29.0-29.9, adult: Secondary | ICD-10-CM | POA: Diagnosis not present

## 2023-08-26 DIAGNOSIS — M62830 Muscle spasm of back: Secondary | ICD-10-CM | POA: Diagnosis not present

## 2023-08-26 DIAGNOSIS — M25551 Pain in right hip: Secondary | ICD-10-CM | POA: Diagnosis not present

## 2023-08-26 DIAGNOSIS — S335XXA Sprain of ligaments of lumbar spine, initial encounter: Secondary | ICD-10-CM | POA: Diagnosis not present

## 2023-09-02 DIAGNOSIS — S335XXA Sprain of ligaments of lumbar spine, initial encounter: Secondary | ICD-10-CM | POA: Diagnosis not present

## 2023-09-02 DIAGNOSIS — M62830 Muscle spasm of back: Secondary | ICD-10-CM | POA: Diagnosis not present

## 2023-09-02 DIAGNOSIS — M25551 Pain in right hip: Secondary | ICD-10-CM | POA: Diagnosis not present

## 2023-09-03 DIAGNOSIS — Z6829 Body mass index (BMI) 29.0-29.9, adult: Secondary | ICD-10-CM | POA: Diagnosis not present

## 2023-09-03 DIAGNOSIS — K219 Gastro-esophageal reflux disease without esophagitis: Secondary | ICD-10-CM | POA: Diagnosis not present

## 2023-09-17 DIAGNOSIS — Z6828 Body mass index (BMI) 28.0-28.9, adult: Secondary | ICD-10-CM | POA: Diagnosis not present

## 2023-09-17 DIAGNOSIS — G479 Sleep disorder, unspecified: Secondary | ICD-10-CM | POA: Diagnosis not present

## 2023-10-14 DIAGNOSIS — Z789 Other specified health status: Secondary | ICD-10-CM | POA: Diagnosis not present

## 2023-10-14 DIAGNOSIS — Z6828 Body mass index (BMI) 28.0-28.9, adult: Secondary | ICD-10-CM | POA: Diagnosis not present
